# Patient Record
Sex: Male | Born: 1970 | Race: Black or African American | Hispanic: No | Marital: Married | Smoking: Current every day smoker
Health system: Southern US, Community
[De-identification: ages and names within clinical notes are randomized; demographics above are authoritative.]

## PROBLEM LIST (undated history)

## (undated) DIAGNOSIS — I10 Essential (primary) hypertension: Secondary | ICD-10-CM

## (undated) DIAGNOSIS — K209 Esophagitis, unspecified without bleeding: Secondary | ICD-10-CM

## (undated) HISTORY — PX: CLAVICLE SURGERY: SHX598

---

## 2021-09-30 ENCOUNTER — Emergency Department: Payer: Commercial Managed Care - HMO

## 2021-09-30 ENCOUNTER — Emergency Department
Admission: EM | Admit: 2021-09-30 | Discharge: 2021-09-30 | Disposition: A | Discharge status: Home / Self Care | Payer: Commercial Managed Care - HMO | Attending: Emergency Medicine | Admitting: Emergency Medicine

## 2021-09-30 ENCOUNTER — Other Ambulatory Visit: Payer: Self-pay

## 2021-09-30 ENCOUNTER — Encounter: Payer: Self-pay | Admitting: Emergency Medicine

## 2021-09-30 DIAGNOSIS — R42 Dizziness and giddiness: Secondary | ICD-10-CM | POA: Insufficient documentation

## 2021-09-30 DIAGNOSIS — I10 Essential (primary) hypertension: Secondary | ICD-10-CM | POA: Insufficient documentation

## 2021-09-30 DIAGNOSIS — D751 Secondary polycythemia: Secondary | ICD-10-CM | POA: Diagnosis not present

## 2021-09-30 HISTORY — DX: Essential (primary) hypertension: I10

## 2021-09-30 LAB — CBC WITH DIFFERENTIAL/PLATELET
Abs Immature Granulocytes: 0.01 10*3/uL (ref 0.00–0.07)
Basophils Absolute: 0.1 10*3/uL (ref 0.0–0.1)
Basophils Relative: 1 %
Eosinophils Absolute: 0.2 10*3/uL (ref 0.0–0.5)
Eosinophils Relative: 3 %
HCT: 64.1 % — ABNORMAL HIGH (ref 39.0–52.0)
Hemoglobin: 21.5 g/dL (ref 13.0–17.0)
Immature Granulocytes: 0 %
Lymphocytes Relative: 21 %
Lymphs Abs: 1.5 10*3/uL (ref 0.7–4.0)
MCH: 32.8 pg (ref 26.0–34.0)
MCHC: 33.5 g/dL (ref 30.0–36.0)
MCV: 97.7 fL (ref 80.0–100.0)
Monocytes Absolute: 0.7 10*3/uL (ref 0.1–1.0)
Monocytes Relative: 10 %
Neutro Abs: 4.5 10*3/uL (ref 1.7–7.7)
Neutrophils Relative %: 65 %
Platelets: 276 10*3/uL (ref 150–400)
RBC: 6.56 MIL/uL — ABNORMAL HIGH (ref 4.22–5.81)
RDW: 13.5 % (ref 11.5–15.5)
WBC: 6.9 10*3/uL (ref 4.0–10.5)
nRBC: 0 % (ref 0.0–0.2)

## 2021-09-30 LAB — BASIC METABOLIC PANEL
Anion gap: 11 (ref 5–15)
BUN: 8 mg/dL (ref 6–20)
CO2: 28 mmol/L (ref 22–32)
Calcium: 9.1 mg/dL (ref 8.9–10.3)
Chloride: 101 mmol/L (ref 98–111)
Creatinine, Ser: 0.93 mg/dL (ref 0.61–1.24)
GFR, Estimated: >60 mL/min (ref >60)
Glucose, Bld: 109 mg/dL — ABNORMAL HIGH (ref 70–99)
Potassium: 3.9 mmol/L (ref 3.5–5.1)
Sodium: 140 mmol/L (ref 135–145)

## 2021-09-30 LAB — CBC
HCT: 63.7 % — ABNORMAL HIGH (ref 39.0–52.0)
Hemoglobin: 21.3 g/dL (ref 13.0–17.0)
MCH: 33.3 pg (ref 26.0–34.0)
MCHC: 33.4 g/dL (ref 30.0–36.0)
MCV: 99.7 fL (ref 80.0–100.0)
Platelets: 242 10*3/uL (ref 150–400)
RBC: 6.39 MIL/uL — ABNORMAL HIGH (ref 4.22–5.81)
RDW: 13.8 % (ref 11.5–15.5)
WBC: 5.7 10*3/uL (ref 4.0–10.5)
nRBC: 0 % (ref 0.0–0.2)

## 2021-09-30 LAB — TROPONIN I (HIGH SENSITIVITY)
Troponin I (High Sensitivity): 7 ng/L (ref <18)
Troponin I (High Sensitivity): 6 ng/L (ref <18)

## 2021-09-30 MED ORDER — AMLODIPINE BESYLATE 5 MG PO TABS: 5.0000 mg | ORAL_TABLET | Freq: Every day | ORAL | 0 refills | Status: DC

## 2021-09-30 NOTE — ED Triage Notes (Signed)
Pt via POV from home. Pt sent over from [facility]. Per KC, pt c/o chest pain, headache today. States that he has been out of his BP medication since October. KC reports a blood pressure of 187/131 Pt is A&OX4 and NAD.  ? ?Denies any symptoms now.  ?

## 2021-09-30 NOTE — Discharge Instructions (Signed)
Your hemoglobin which is your red blood cell count was very elevated today.  We drew off some of your blood to bring this number down.  You will need to follow-up with oncology.  If you do not hear from them within the next several days please call the number above for Dr. Assunta Gambles office. ? ?I am starting you on the blood pressure medicine called amlodipine.  You can start taking this once a day.  It is important you follow-up with the primary care provider.  I have given you several referrals above. ?

## 2021-09-30 NOTE — ED Provider Notes (Signed)
? ?[facility] ?Provider Note ? ? ? Event Date/Time  ? First MD Initiated Contact with Patient 09/30/21 1728   ?  (approximate) ? ? ?History  ? ?Hypertension ? ? ?HPI ? ?Jeffrey House is a 51 y.o. male with past medical history of hypertension who presents with headache lightheadedness concern for elevated blood pressure.  Patient has been off of his blood pressure medication since October when he ran out.  He over the last several days has had some intermittent lightheadedness and mild frontal headache.  Denies visual change numbness tingling weakness.  Last night did have some associated chest tightness but this resolved after several minutes.  Denies shortness of breath.  Denies any symptoms currently.  Patient has been told that he had elevated blood counts in the past was supposed to see a hematologist prior to moving but has not yet established care.  Denies any pruritus weight loss fevers.  Has had some night sweats for some time. ? ?  ? ?Past Medical History:  ?Diagnosis Date  ? Hypertension   ? ? ?There are no problems to display for this patient. ? ? ? ?Physical Exam  ?Triage Vital Signs: ?ED Triage Vitals  ?Enc Vitals Group  ?   BP 09/30/21 1621 (!) 155/121  ?   Pulse Rate 09/30/21 1621 99  ?   Resp 09/30/21 1621 18  ?   Temp 09/30/21 1621 98.5 ?F (36.9 ?C)  ?   Temp Source 09/30/21 1621 Oral  ?   SpO2 09/30/21 1621 95 %  ?   Weight 09/30/21 1609 160 lb (72.6 kg)  ?   Height 09/30/21 1609 5\' 8"  (1.727 m)  ?   Head Circumference --   ?   Peak Flow --   ?   Pain Score 09/30/21 1609 0  ?   Pain Loc --   ?   Pain Edu? --   ?   Excl. in GC? --   ? ? ?Most recent vital signs: ?Vitals:  ? 09/30/21 1900 09/30/21 1930  ?BP: (!) 154/118 (!) 151/119  ?Pulse: 86 97  ?Resp: 18 20  ?Temp:    ?SpO2: 95% 98%  ? ? ? ?General: Awake, no distress.  ?CV:  Good peripheral perfusion.  ?Resp:  Normal effort.  ?Abd:  No distention.  ?Neuro:             Awake, Alert, Oriented x 3  ?Other:  Aox3, nml  speech  ?PERRL, EOMI, face symmetric, nml tongue movement  ?5/5 strength in the BL upper and lower extremities  ?Sensation grossly intact in the BL upper and lower extremities  ?Finger-nose-finger intact BL ? ? ? ?ED Results / Procedures / Treatments  ?Labs ?(all labs ordered are listed, but only abnormal results are displayed) ?Labs Reviewed  ?BASIC METABOLIC PANEL - Abnormal; Notable for the following components:  ?    Result Value  ? Glucose, Bld 109 (*)   ? All other components within normal limits  ?CBC - Abnormal; Notable for the following components:  ? RBC 6.39 (*)   ? Hemoglobin 21.3 (*)   ? HCT 63.7 (*)   ? All other components within normal limits  ?JAK2 GENOTYPR  ?JAK2 EXONS 12-15  ?ERYTHROPOIETIN  ?TESTOSTERONE  ?CBC WITH DIFFERENTIAL/PLATELET  ?TROPONIN I (HIGH SENSITIVITY)  ?TROPONIN I (HIGH SENSITIVITY)  ? ? ? ?EKG ? ?EKG interpretation performed by myself: NSR, nml axis, nml intervals, no acute ischemic changes ? ? ?RADIOLOGY ?I reviewed the  CXR which does not show any acute cardiopulmonary process; agree with radiology report  ? ? ? ?PROCEDURES: ? ?Critical Care performed: No ? ?Procedures ? ?The patient is on the cardiac monitor to evaluate for evidence of arrhythmia and/or significant heart rate changes. ? ? ?MEDICATIONS ORDERED IN ED: ?Medications - No data to display ? ? ?IMPRESSION / MDM / ASSESSMENT AND PLAN / ED COURSE  ?I reviewed the triage vital signs and the nursing notes. ?             ?               ? ?Differential diagnosis includes, but is not limited to, asymptomatic hypertension, hypertensive emergency, less likely ACS ? ?Patient is a 51 year old male who presents with several vague symptoms including headache lightheadedness chest pain going on for the last several days.  He attributes this to elevated blood pressure.  Has been off of his blood pressure medications for the last several months from when he ran out.  Blood pressure is 150/1 20s.  Patient has no symptoms now.  He  has no neurologic symptoms associate with headache and a nonfocal neurologic exam.  Initial troponin is negative EKG is nonischemic had some mild chest discomfort yesterday which quickly resolved without associated symptoms.  Notably patient's hemoglobin is 21 with hematocrit of 63.  On further questioning he has been told that he had elevated blood counts in the past but moved prior to following up with hematology.  Does smoke cigarettes.  Discussed with Dr. Smith Robert on-call for oncology she recommends phlebotomizing 500 cc then he can follow-up with her. ? ? ?Patient was phlebotomize 500 cc in the ED.  Additional labs sent.  He is currently asymptomatic.  Discussed follow-up with Dr. Smith Robert as well as follow-up with primary care.  Sent prescription for amlodipine to his pharmacy. ? ?Clinical Course as of 09/30/21 2020  ?Tue Sep 30, 2021  ?1816 Hemoglobin(!!): 21.3 [KM]  ?  ?Clinical Course User Index ?[KM] Georga Hacking, MD  ? ? ? ?FINAL CLINICAL IMPRESSION(S) / ED DIAGNOSES  ? ?Final diagnoses:  ?Polycythemia  ?Hypertension, unspecified type  ? ? ? ?Rx / DC Orders  ? ?ED Discharge Orders   ? ?      Ordered  ?  amLODipine (NORVASC) 5 MG tablet  Daily       ? 09/30/21 2019  ? ?  ?  ? ?  ? ? ? ?Note:  This document was prepared using Dragon voice recognition software and may include unintentional dictation errors. ?  ?Georga Hacking, MD ?09/30/21 2020 ? ?

## 2021-09-30 NOTE — ED Notes (Signed)
565mL blood pulled from IV to waste Per MD and oncology for HGB results.  ?

## 2021-10-01 ENCOUNTER — Other Ambulatory Visit: Payer: Self-pay | Admitting: Oncology

## 2021-10-01 DIAGNOSIS — D751 Secondary polycythemia: Secondary | ICD-10-CM | POA: Insufficient documentation

## 2021-10-02 ENCOUNTER — Other Ambulatory Visit: Payer: Self-pay

## 2021-10-02 DIAGNOSIS — D751 Secondary polycythemia: Secondary | ICD-10-CM

## 2021-10-02 LAB — TESTOSTERONE: Testosterone: 425 ng/dL (ref 264–916)

## 2021-10-03 ENCOUNTER — Other Ambulatory Visit: Payer: Managed Care, Other (non HMO)

## 2021-10-03 ENCOUNTER — Encounter: Payer: Managed Care, Other (non HMO) | Admitting: Oncology

## 2021-10-03 ENCOUNTER — Inpatient Hospital Stay: Payer: Commercial Managed Care - HMO | Attending: Oncology

## 2021-10-03 ENCOUNTER — Inpatient Hospital Stay: Payer: Commercial Managed Care - HMO

## 2021-10-03 VITALS — BP 151/105 | HR 99 | Temp 96.8°F | Resp 16

## 2021-10-03 DIAGNOSIS — Z79899 Other long term (current) drug therapy: Secondary | ICD-10-CM | POA: Diagnosis not present

## 2021-10-03 DIAGNOSIS — F17210 Nicotine dependence, cigarettes, uncomplicated: Secondary | ICD-10-CM | POA: Insufficient documentation

## 2021-10-03 DIAGNOSIS — D751 Secondary polycythemia: Secondary | ICD-10-CM

## 2021-10-03 DIAGNOSIS — I10 Essential (primary) hypertension: Secondary | ICD-10-CM | POA: Diagnosis not present

## 2021-10-03 LAB — COMPREHENSIVE METABOLIC PANEL
ALT: 18 U/L (ref 0–44)
AST: 21 U/L (ref 15–41)
Albumin: 3.9 g/dL (ref 3.5–5.0)
Alkaline Phosphatase: 127 U/L — ABNORMAL HIGH (ref 38–126)
Anion gap: 12 (ref 5–15)
BUN: 7 mg/dL (ref 6–20)
CO2: 28 mmol/L (ref 22–32)
Calcium: 9.4 mg/dL (ref 8.9–10.3)
Chloride: 98 mmol/L (ref 98–111)
Creatinine, Ser: 0.75 mg/dL (ref 0.61–1.24)
GFR, Estimated: >60 mL/min (ref >60)
Glucose, Bld: 115 mg/dL — ABNORMAL HIGH (ref 70–99)
Potassium: 3.9 mmol/L (ref 3.5–5.1)
Sodium: 138 mmol/L (ref 135–145)
Total Bilirubin: 0.7 mg/dL (ref 0.3–1.2)
Total Protein: 7.7 g/dL (ref 6.5–8.1)

## 2021-10-03 LAB — CBC WITH DIFFERENTIAL/PLATELET
Abs Immature Granulocytes: 0.01 10*3/uL (ref 0.00–0.07)
Basophils Absolute: 0.1 10*3/uL (ref 0.0–0.1)
Basophils Relative: 1 %
Eosinophils Absolute: 0.1 10*3/uL (ref 0.0–0.5)
Eosinophils Relative: 2 %
HCT: 62.3 % — ABNORMAL HIGH (ref 39.0–52.0)
Hemoglobin: 21.3 g/dL (ref 13.0–17.0)
Immature Granulocytes: 0 %
Lymphocytes Relative: 23 %
Lymphs Abs: 1.3 10*3/uL (ref 0.7–4.0)
MCH: 33.6 pg (ref 26.0–34.0)
MCHC: 34.2 g/dL (ref 30.0–36.0)
MCV: 98.3 fL (ref 80.0–100.0)
Monocytes Absolute: 0.5 10*3/uL (ref 0.1–1.0)
Monocytes Relative: 9 %
Neutro Abs: 3.5 10*3/uL (ref 1.7–7.7)
Neutrophils Relative %: 65 %
Platelets: 253 10*3/uL (ref 150–400)
RBC: 6.34 MIL/uL — ABNORMAL HIGH (ref 4.22–5.81)
RDW: 13.5 % (ref 11.5–15.5)
WBC: 5.4 10*3/uL (ref 4.0–10.5)
nRBC: 0 % (ref 0.0–0.2)

## 2021-10-03 NOTE — Patient Instructions (Addendum)
[facility] AT [place]  Discharge Instructions: ?Thank you for choosing [facility] to provide your oncology and hematology care.  ?If you have a lab appointment with the [facility], please go directly to the [facility] and check in at the registration area. ? ?We strive to give you quality time with your provider. You may need to reschedule your appointment if you arrive late (15 or more minutes).  Arriving late affects you and other patients whose appointments are after yours.  Also, if you miss three or more appointments without notifying the office, you may be dismissed from the clinic at the provider?s discretion.    ?  ?For prescription refill requests, have your pharmacy contact our office and allow 72 hours for refills to be completed.   ? ?Should you have questions after your visit or need to cancel or reschedule your appointment, please contact [facility] [place] [facility] [place]  [phone] and follow the prompts.  Office hours are 8:00 a.m. to 4:30 p.m. Monday - Friday. Please note that voicemails left after 4:00 p.m. may not be returned until the following business day.  We are closed weekends and major holidays. You have access to a nurse at all times for urgent questions. Please call the main number to the clinic [phone] and follow the prompts. ? ?For any non-urgent questions, you may also contact your provider using MyChart. We now offer e-Visits for anyone 21 and older to request care online for non-urgent symptoms. For details visit [email or web address]. ?  ?Also download the MyChart app! Go to the app store, search "MyChart", open the app, select [facility], and log in with your MyChart username and password. ? ?Due to Covid, a mask is required upon entering the hospital/clinic. If you do not have a mask, one will be given to you upon arrival. For doctor visits, patients may have 1 support person aged 33 or older with them. For  treatment visits, patients cannot have anyone with them due to current Covid guidelines and our immunocompromised population.  ? ?Therapeutic Phlebotomy, Care After ?The following information offers guidance on how to care for yourself after your procedure. Your health care provider may also give you more specific instructions. If you have problems or questions, contact your health care provider. ?What can I expect after the procedure? ?After therapeutic phlebotomy, it is common to have: ?Light-headedness or dizziness. You may feel faint. ?Nausea. ?Tiredness (fatigue). ?Follow these instructions at home: ?Eating and drinking ?Be sure to eat well-balanced meals for the next 24 hours. ?Drink enough fluid to keep your urine pale yellow. ?Avoid drinking alcohol on the day that you had the procedure. ?Activity ? ?Return to your normal activities as told by your health care provider. Most people can go back to their normal activities right away. ?Avoid activities that take a lot of effort for about 5 hours after the procedure. Athletes should avoid strenuous exercise for at least 12 hours. ?Avoid heavy lifting or pulling for about 5 hours after the procedure. Do not lift anything that is heavier than 10 lb (4.5 kg). ?Change positions slowly for the remainder of the day, like from sitting to standing. This can help prevent light-headedness or fainting. ?If you feel light-headed, lie down until the feeling goes away. ?Needle insertion site care ? ?Keep your bandage (dressing) dry. You can remove the bandage after about 5 hours or as told by your health care provider. ?If you have bleeding from the needle insertion  site, raise (elevate) your arm and press firmly on the site until the bleeding stops. ?If you have bruising at the site, apply ice to the area. To do this: ?Put ice in a plastic bag. ?Place a towel between your skin and the bag. ?Leave the ice on for 20 minutes, 2-3 times a day for the first 24 hours. ?Remove the  ice if your skin turns bright red so you do not damage the area. ?If the swelling does not go away after 24 hours, apply a warm, moist cloth (warm compress) to the area for 20 minutes, 2-3 times a day. ?General instructions ?Do not use any products that contain nicotine or tobacco, like cigarettes, chewing tobacco, and vaping devices, such as e-cigarettes, for at least 30 minutes after the procedure. If you need help quitting, ask your health care provider. ?Keep all follow-up visits. You may need to continue having regular blood tests and therapeutic phlebotomy treatments as directed. ?Contact a health care provider if: ?You have redness, swelling, or pain at the needle insertion site. ?Fluid or blood is coming from the needle insertion site. ?Pus or a bad smell is coming from the needle insertion site. ?The needle insertion site feels warm to the touch. ?You feel light-headed, dizzy, or nauseous, and the feeling does not go away. ?You have new bruising at the needle insertion site. ?You feel weaker than normal. ?You have a fever or chills. ?Get help right away if: ?You have chest pain. ?You have trouble breathing. ?You have severe nausea or vomiting. ?Summary ?After the procedure, it is common to have some light-headedness, dizziness, nausea, or tiredness (fatigue). ?Be sure to eat well-balanced meals for the next 24 hours. Drink enough fluid to keep your urine pale yellow. ?Return to your normal activities as told by your health care provider. ?Keep all follow-up visits. You may need to continue having regular blood tests and therapeutic phlebotomy treatments as directed. ?This information is not intended to replace advice given to you by your health care provider. Make sure you discuss any questions you have with your health care provider. ?Document Revised: 11/27/2020 Document Reviewed: 11/27/2020 ?Elsevier Patient Education ? [place]. ? ?

## 2021-10-06 ENCOUNTER — Encounter: Payer: Self-pay | Admitting: Oncology

## 2021-10-06 ENCOUNTER — Telehealth: Payer: Self-pay | Admitting: *Deleted

## 2021-10-06 NOTE — Telephone Encounter (Signed)
Note for work was made. Contacted pt to inform that letter is ready. Per wife, pt will pick up letter tomorrow. Informed her it will be downstairs at the front desk. ?

## 2021-10-06 NOTE — Telephone Encounter (Signed)
Patient called requesting a letter to return to work today ?

## 2021-10-07 ENCOUNTER — Encounter: Payer: Self-pay | Admitting: Oncology

## 2021-10-07 ENCOUNTER — Telehealth: Payer: Self-pay | Admitting: Oncology

## 2021-10-08 ENCOUNTER — Other Ambulatory Visit: Payer: Self-pay

## 2021-10-08 DIAGNOSIS — D751 Secondary polycythemia: Secondary | ICD-10-CM

## 2021-10-10 ENCOUNTER — Encounter: Payer: Managed Care, Other (non HMO) | Admitting: Oncology

## 2021-10-10 ENCOUNTER — Inpatient Hospital Stay: Payer: Commercial Managed Care - HMO

## 2021-10-10 ENCOUNTER — Other Ambulatory Visit: Payer: Managed Care, Other (non HMO)

## 2021-10-10 ENCOUNTER — Encounter: Payer: Self-pay | Admitting: Oncology

## 2021-10-10 ENCOUNTER — Inpatient Hospital Stay (HOSPITAL_BASED_OUTPATIENT_CLINIC_OR_DEPARTMENT_OTHER): Payer: Commercial Managed Care - HMO | Admitting: Oncology

## 2021-10-10 VITALS — BP 142/108 | HR 89 | Temp 97.9°F | Resp 18 | Ht 68.5 in | Wt 166.0 lb

## 2021-10-10 DIAGNOSIS — F17210 Nicotine dependence, cigarettes, uncomplicated: Secondary | ICD-10-CM

## 2021-10-10 DIAGNOSIS — D751 Secondary polycythemia: Secondary | ICD-10-CM

## 2021-10-10 DIAGNOSIS — I10 Essential (primary) hypertension: Secondary | ICD-10-CM

## 2021-10-10 LAB — HEMOGLOBIN AND HEMATOCRIT, BLOOD
HCT: 51.9 % (ref 39.0–52.0)
Hemoglobin: 17.6 g/dL — ABNORMAL HIGH (ref 13.0–17.0)

## 2021-10-10 NOTE — Progress Notes (Signed)
Per MD, no phlebotomy today. 

## 2021-10-10 NOTE — Progress Notes (Signed)
Pt was escorted to ED after initial consult with internal med on 4/18for an elevated BP. ?

## 2021-10-10 NOTE — Progress Notes (Signed)
? ?Hematology/Oncology Consult note ?[facility] [place] ?[phone] [phone] ? ?Patient Care Team: ?Pcp, No as PCP - General  ? ?Name of the patient: Jeffrey House  ?[id number]  ?07-01-1970  ? ? ?Reason for referral-polycythemia ?  ?Referring physician-Dr. Starleen Blue ? ?Date of visit: 10/10/21 ? ? ?History of presenting illness- Patient is a 51 year old African-American male who presented to the ER with symptoms of headache and lightheadedness.  He was noted to have an increased him hemoglobin and hematocrit of 21/63.7.  He received 500 cc of phlebotomy in the ER and subsequently 1 more session of phlebotomy as an outpatient.  Work-up so far for polycythemia showed negative JAK2 mutation.  Exon 12 mutation testing pending.  EPO levels were low normal at 4.8.  Testosterone level normal at 425.  Patient is a chronic smoker and states that 1 pack of cigarettes usually lasts him for about 2 weeks.  No known chronic lung disease.  Reports getting a good night sleep and denies any snoring at night.  Denies any blood loss in his urine.  Overall appetite is good and he denies any unintentional weight loss. ? ?ECOG PS- 1 ? ?Pain scale- 0 ? ? ?Review of systems- Review of Systems  ?Constitutional:  Positive for malaise/fatigue. Negative for chills, fever and weight loss.  ?HENT:  Negative for congestion, ear discharge and nosebleeds.   ?Eyes:  Negative for blurred vision.  ?Respiratory:  Negative for cough, hemoptysis, sputum production, shortness of breath and wheezing.   ?Cardiovascular:  Negative for chest pain, palpitations, orthopnea and claudication.  ?Gastrointestinal:  Negative for abdominal pain, blood in stool, constipation, diarrhea, heartburn, melena, nausea and vomiting.  ?Genitourinary:  Negative for dysuria, flank pain, frequency, hematuria and urgency.  ?Musculoskeletal:  Negative for back pain, joint pain and myalgias.  ?Skin:  Negative for rash.  ?Neurological:  Negative  for dizziness, tingling, focal weakness, seizures, weakness and headaches.  ?Endo/Heme/Allergies:  Does not bruise/bleed easily.  ?Psychiatric/Behavioral:  Negative for depression and suicidal ideas. The patient does not have insomnia.   ? ?Allergies  ?Allergen Reactions  ? Asa [Aspirin]   ? Latex   ? ? ?Patient Active Problem List  ? Diagnosis Date Noted  ? Hypertension 10/10/2021  ? Polycythemia 10/01/2021  ? ? ? ?Past Medical History:  ?Diagnosis Date  ? Hypertension   ? ? ? ?History reviewed. No pertinent surgical history. ? ?Social History  ? ?Socioeconomic History  ? Marital status: Married  ?  Spouse name: Not on file  ? Number of children: Not on file  ? Years of education: Not on file  ? Highest education level: Not on file  ?Occupational History  ? Not on file  ?Tobacco Use  ? Smoking status: Every Day  ?  Packs/day: 0.25  ?  Types: Cigarettes  ? Smokeless tobacco: Never  ?Substance and Sexual Activity  ? Alcohol use: Yes  ?  Comment: 1 drink in the past 2 weeks.  ? Drug use: Not on file  ? Sexual activity: Not on file  ?Other Topics Concern  ? Not on file  ?Social History Narrative  ? Not on file  ? ?Social Determinants of Health  ? ?Financial Resource Strain: Not on file  ?Food Insecurity: Not on file  ?Transportation Needs: Not on file  ?Physical Activity: Not on file  ?Stress: Not on file  ?Social Connections: Not on file  ?Intimate Partner Violence: Not on file  ? ?  ?Family History  ?Problem Relation Age  of Onset  ? Hypertension Mother   ? Hypertension Sister   ? ? ? ?Current Outpatient Medications:  ?  amLODipine (NORVASC) 5 MG tablet, Take 1 tablet (5 mg total) by mouth daily., Disp: 30 tablet, Rfl: 0 ?  Aspirin 325 MG CAPS, Take by mouth., Disp: , Rfl:  ? ? ?Physical exam:  ?Vitals:  ? 10/10/21 0948  ?BP: (!) 142/108  ?Pulse: 89  ?Resp: 18  ?Temp: 97.9 ?F (36.6 ?C)  ?SpO2: 100%  ?Weight: 166 lb (75.3 kg)  ?Height: 5' 8.5" (1.74 m)  ? ?Physical Exam ?Constitutional:   ?   General: He is not in  acute distress. ?Cardiovascular:  ?   Rate and Rhythm: Normal rate and regular rhythm.  ?   Heart sounds: Normal heart sounds.  ?Pulmonary:  ?   Effort: Pulmonary effort is normal.  ?   Breath sounds: Normal breath sounds.  ?Abdominal:  ?   General: Bowel sounds are normal.  ?   Palpations: Abdomen is soft.  ?   Comments: No palpable splenomegaly  ?Skin: ?   General: Skin is warm and dry.  ?Neurological:  ?   Mental Status: He is alert and oriented to person, place, and time.  ?  ? ? ? ? ?  Latest Ref Rng & Units 10/03/2021  ?  8:48 AM  ?CMP  ?Glucose 70 - 99 mg/dL 115    ?BUN 6 - 20 mg/dL 7    ?Creatinine 0.61 - 1.24 mg/dL 0.75    ?Sodium 135 - 145 mmol/L 138    ?Potassium 3.5 - 5.1 mmol/L 3.9    ?Chloride 98 - 111 mmol/L 98    ?CO2 22 - 32 mmol/L 28    ?Calcium 8.9 - 10.3 mg/dL 9.4    ?Total Protein 6.5 - 8.1 g/dL 7.7    ?Total Bilirubin 0.3 - 1.2 mg/dL 0.7    ?Alkaline Phos 38 - 126 U/L 127    ?AST 15 - 41 U/L 21    ?ALT 0 - 44 U/L 18    ? ? ?  Latest Ref Rng & Units 10/10/2021  ?  9:35 AM  ?CBC  ?Hemoglobin 13.0 - 17.0 g/dL 17.6    ?Hematocrit 39.0 - 52.0 % 51.9    ? ? ?No images are attached to the encounter. ? ?DG Chest 2 View ? ?Result Date: 09/30/2021 ?CLINICAL DATA:  Chest pain. EXAM: CHEST - 2 VIEW COMPARISON:  None. FINDINGS: Cardiac silhouette is of normal size. Mildly enlarged azygous vein contour on the right mediastinum. The lungs are clear. No pleural effusion or pneumothorax. No acute skeletal abnormality. IMPRESSION: No acute lung process. Mildly prominent azygous vein border at the junction with the superior vena cava. This may be normal for this patient. Electronically Signed   By: Yvonne Kendall M.D.   On: 09/30/2021 17:06   ? ?Assessment and plan- Patient is a 51 y.o. male referred for polycythemia ? 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  Despite JAK2 mutation being negative his EPO level is at the lower limit of normal at 4.8.  Typically with a  hemoglobin of 21 of this was a secondary polycythemia we would expect his EPO levels to be high.  I will therefore proceed with a bone marrow biopsy at this time to rule out a primary myeloproliferative process.  I will also be doing a myeloid panel sequencing either on the bone marrow or in his peripheral blood.  Exon 12 mutation testing  is also pending.  After 2 sessions of phlebotomy his hemoglobin and hematocrit have come down to 17.6/51.9 today.  I will hold off on phlebotomy at this time and consider H&H for possible phlebotomy if hematocrit is greater than 55 every 2 weeks until we have the results of the bone marrow biopsy back. ? ?In terms of causes for secondary polycythemia: Given that his EPO levels are low normal I do not suspect tumor induced polycythemia.  If bone marrow biopsy does not reveal a primary myeloproliferative process I will do further work-up for secondary polycythemia including chest x-ray, sleep study and consideration for echocardiogram with bubble study. ? ?I will see him back in about 3 weeks time after the results of bone marrow biopsy are back.  Discussed risks and benefits of bone marrow biopsy including all but not limited to biopsy site pain and possible infection.  Patient understands and agrees to proceed as planned ? ? ?Thank you for this kind referral and the opportunity to participate in the care of this patient ? ? ?Visit Diagnosis ?1. Hypertension, unspecified type   ?2. Polycythemia   ? ? ?Dr. Randa Evens, MD, MPH ?[facility] ?[phone] ?10/10/2021 ? ? ? ? ? ? ? ?    ? ? ? ? ?

## 2021-10-13 LAB — ERYTHROPOIETIN: Erythropoietin: 4.8 m[IU]/mL (ref 2.6–18.5)

## 2021-10-13 LAB — JAK2 GENOTYPR

## 2021-10-14 LAB — JAK2 EXONS 12-15

## 2021-10-16 ENCOUNTER — Other Ambulatory Visit: Payer: Self-pay | Admitting: *Deleted

## 2021-10-16 ENCOUNTER — Other Ambulatory Visit: Payer: Self-pay | Admitting: Student

## 2021-10-16 ENCOUNTER — Telehealth: Payer: Self-pay | Admitting: *Deleted

## 2021-10-16 DIAGNOSIS — D751 Secondary polycythemia: Secondary | ICD-10-CM

## 2021-10-16 NOTE — Telephone Encounter (Signed)
Called patient's home phone number and it rang and rang and then it says please try back again patient does not have any voicemail. ?Patient is needing to have a bone marrow biopsy and the date that it is going to be done is made on may 8  arrival at 7: 30 [facility] medical mall.  Patient will get medicine to make him sleepy so he will need a driver.  Patient will be n.p.o. for 8 hours prior to the time of procedure.  If patient has any medicines that he has to take it will be with just a sip of water.  Usually the patient is going to be there between 2 and 4 hours.  I will try again to see if I can get the patient on the phone to go over all these instructions ?

## 2021-10-17 ENCOUNTER — Encounter: Payer: Self-pay | Admitting: Oncology

## 2021-10-17 LAB — NGS JAK2 EXONS 12-15

## 2021-10-20 ENCOUNTER — Ambulatory Visit: Admission: RE | Admit: 2021-10-20 | Payer: Managed Care, Other (non HMO) | Source: Ambulatory Visit

## 2021-10-20 ENCOUNTER — Inpatient Hospital Stay
Admission: RE | Admit: 2021-10-20 | Payer: Managed Care, Other (non HMO) | Source: Ambulatory Visit | Admitting: Radiology

## 2021-10-20 LAB — INTELLIGEN MYELOID

## 2021-10-24 ENCOUNTER — Inpatient Hospital Stay: Payer: Commercial Managed Care - HMO

## 2021-10-24 ENCOUNTER — Inpatient Hospital Stay: Payer: Commercial Managed Care - HMO | Attending: Oncology

## 2021-10-27 ENCOUNTER — Telehealth: Payer: Self-pay

## 2021-10-27 NOTE — Progress Notes (Signed)
Patient on schedule for BMB 5/24, spoke with patients wife on phone with pre procedure instructions given, made aware to be to be here at 0730, NPO after MN prior to procedure as well as driver post procedure/recovery/discharge.stated understanding. ?

## 2021-10-27 NOTE — Telephone Encounter (Signed)
Reached out to pt in regards to BMB on 11/05/21 for a 7:30am arrival for a 8:30am procedure pt was having scheduling conflict so I wanted to make sure pt was okay with appointment time/date. Pt stated so far he will be able to make the [facility] appointment and if anything arises please contact us ASAP to make the neccesarry changes. Pt agrees and understands.  ?

## 2021-10-30 ENCOUNTER — Other Ambulatory Visit: Payer: Self-pay

## 2021-10-30 ENCOUNTER — Telehealth: Payer: Self-pay | Admitting: Oncology

## 2021-10-30 ENCOUNTER — Telehealth: Payer: Self-pay

## 2021-10-30 DIAGNOSIS — I10 Essential (primary) hypertension: Secondary | ICD-10-CM

## 2021-10-30 MED ORDER — AMLODIPINE BESYLATE 5 MG PO TABS: 5.0000 mg | ORAL_TABLET | Freq: Every day | ORAL | 0 refills | Status: DC

## 2021-10-30 NOTE — Telephone Encounter (Signed)
Called pt to r/s Phlembotomy appt, pt states that he cannot do afternoon appts.Marland KitchenMarland KitchenKJ

## 2021-10-30 NOTE — Telephone Encounter (Signed)
Pt was referred to Dr. Walden Field in Internal Medicine due to in need for a PCP.

## 2021-10-31 ENCOUNTER — Inpatient Hospital Stay: Payer: Commercial Managed Care - HMO | Admitting: Oncology

## 2021-11-04 ENCOUNTER — Other Ambulatory Visit: Payer: Self-pay | Admitting: Radiology

## 2021-11-05 ENCOUNTER — Encounter: Payer: Self-pay | Admitting: Radiology

## 2021-11-05 ENCOUNTER — Other Ambulatory Visit: Payer: Self-pay

## 2021-11-05 ENCOUNTER — Ambulatory Visit
Admission: RE | Admit: 2021-11-05 | Discharge: 2021-11-05 | Disposition: A | Discharge status: Home / Self Care | Payer: Commercial Managed Care - HMO | Source: Ambulatory Visit | Attending: Oncology | Admitting: Oncology

## 2021-11-05 DIAGNOSIS — I10 Essential (primary) hypertension: Secondary | ICD-10-CM | POA: Diagnosis not present

## 2021-11-05 DIAGNOSIS — D751 Secondary polycythemia: Secondary | ICD-10-CM | POA: Diagnosis present

## 2021-11-05 HISTORY — PX: IR BONE MARROW BIOPSY & ASPIRATION: IMG5727

## 2021-11-05 LAB — CBC WITH DIFFERENTIAL/PLATELET
Abs Immature Granulocytes: 0.04 10*3/uL (ref 0.00–0.07)
Basophils Absolute: 0.1 10*3/uL (ref 0.0–0.1)
Basophils Relative: 1 %
Eosinophils Absolute: 0.4 10*3/uL (ref 0.0–0.5)
Eosinophils Relative: 4 %
HCT: 56.1 % — ABNORMAL HIGH (ref 39.0–52.0)
Hemoglobin: 19.2 g/dL — ABNORMAL HIGH (ref 13.0–17.0)
Immature Granulocytes: 1 %
Lymphocytes Relative: 16 %
Lymphs Abs: 1.3 10*3/uL (ref 0.7–4.0)
MCH: 33.3 pg (ref 26.0–34.0)
MCHC: 34.2 g/dL (ref 30.0–36.0)
MCV: 97.4 fL (ref 80.0–100.0)
Monocytes Absolute: 0.9 10*3/uL (ref 0.1–1.0)
Monocytes Relative: 11 %
Neutro Abs: 5.5 10*3/uL (ref 1.7–7.7)
Neutrophils Relative %: 67 %
Platelets: 262 10*3/uL (ref 150–400)
RBC: 5.76 MIL/uL (ref 4.22–5.81)
RDW: 13.2 % (ref 11.5–15.5)
WBC: 8.2 10*3/uL (ref 4.0–10.5)
nRBC: 0 % (ref 0.0–0.2)

## 2021-11-05 MED ORDER — FENTANYL CITRATE (PF) 100 MCG/2ML IJ SOLN
INTRAMUSCULAR | Status: AC | PRN
  Administered 2021-11-05: 50 ug via INTRAVENOUS

## 2021-11-05 MED ORDER — FENTANYL CITRATE (PF) 100 MCG/2ML IJ SOLN
INTRAMUSCULAR | Status: AC
  Filled 2021-11-05: qty 2

## 2021-11-05 MED ORDER — SODIUM CHLORIDE 0.9 % IV SOLN
INTRAVENOUS | Status: DC
  Filled 2021-11-05: qty 1000

## 2021-11-05 MED ORDER — MIDAZOLAM HCL 5 MG/5ML IJ SOLN
INTRAMUSCULAR | Status: AC | PRN
Start: 2021-11-05 — End: 2021-11-05
  Administered 2021-11-05: 1 mg via INTRAVENOUS

## 2021-11-05 MED ORDER — LIDOCAINE 1% INJECTION FOR CIRCUMCISION
INJECTION | INTRAVENOUS | Status: DC | PRN
  Administered 2021-11-05: 10 mL via SUBCUTANEOUS

## 2021-11-05 MED ORDER — MIDAZOLAM HCL 2 MG/2ML IJ SOLN
INTRAMUSCULAR | Status: AC
  Filled 2021-11-05: qty 2

## 2021-11-05 MED ORDER — HEPARIN SOD (PORK) LOCK FLUSH 100 UNIT/ML IV SOLN
INTRAVENOUS | Status: AC
  Filled 2021-11-05: qty 5

## 2021-11-05 NOTE — Procedures (Signed)
Interventional Radiology Procedure Note  Date of Procedure: 11/05/2021  Procedure: BMBx  Findings:  1. BMBx right ilium    Complications: No immediate complications noted.   Estimated Blood Loss: minimal  Follow-up and Recommendations: 1. Bedrest 1 hour    Olive Bass, MD  Vascular & Interventional Radiology  11/05/2021 9:25 AM

## 2021-11-05 NOTE — Consult Note (Signed)
 Chief Complaint: Patient was seen in consultation today for polycythemia at the request of Rao,Archana C  Referring Physician(s): Rao,Archana C  Supervising Physician: El-Abd, Yasser J  Patient Status: [facility] - Out-pt  History of Present Illness: Jeffrey House is a 50 y.o. male with PMHx of HTN, with newly diagnosed polycythemia who has been seen by hematology, Dr. Rao on 4/28 and request received for image guided bone marrow biopsy with moderate sedation.  The patient has had a H&P performed within the last 30 days, all history, medications, and exam have been reviewed. The patient denies any interval changes since the H&P.  The patient denies any current chest pain or shortness of breath. The patient denies any history of sleep apnea or chronic oxygen use. He has no known complications to sedation.    Past Medical History:  Diagnosis Date   Hypertension     Past Surgical History:  Procedure Laterality Date   CLAVICLE SURGERY Right     Allergies: Asa [aspirin] and Latex  Medications: Prior to Admission medications   Medication Sig Start Date End Date Taking? Authorizing Provider  amLODipine (NORVASC) 5 MG tablet Take 1 tablet (5 mg total) by mouth daily. 10/30/21 11/29/21 Yes Rao, Archana C, MD  Aspirin 325 MG CAPS Take by mouth.   Yes [provider]     Family History  Problem Relation Age of Onset   Hypertension Mother    Hypertension Sister     Social History   Socioeconomic History   Marital status: Married    Spouse name: Not on file   Number of children: Not on file   Years of education: Not on file   Highest education level: Not on file  Occupational History   Not on file  Tobacco Use   Smoking status: Every Day    Packs/day: 0.25    Types: Cigarettes   Smokeless tobacco: Never  Substance and Sexual Activity   Alcohol use: Yes    Comment: 1 drink in the past 2 weeks.   Drug use: Not on file   Sexual activity: Not on file  Other  Topics Concern   Not on file  Social History Narrative   Not on file   Social Determinants of Health   Financial Resource Strain: Not on file  Food Insecurity: Not on file  Transportation Needs: Not on file  Physical Activity: Not on file  Stress: Not on file  Social Connections: Not on file   Review of Systems: A 12 point ROS discussed and pertinent positives are indicated in the HPI above.  All other systems are negative.  Review of Systems  Vital Signs: BP (!) 151/110   Pulse 87   Temp 98.2 F (36.8 C) (Oral)   Resp 17   Ht 5' 8" (1.727 m)   Wt 165 lb (74.8 kg)   SpO2 99%   BMI 25.09 kg/m   Physical Exam Constitutional:      Appearance: Normal appearance.  HENT:     Head: Normocephalic and atraumatic.  Cardiovascular:     Rate and Rhythm: Normal rate and regular rhythm.  Pulmonary:     Effort: Pulmonary effort is normal. No respiratory distress.  Neurological:     Mental Status: He is alert and oriented to person, place, and time.    Imaging: No results found.  Labs:  CBC: Recent Labs    09/30/21 1623 09/30/21 1941 10/03/21 0848 10/10/21 0935 11/05/21 0751  WBC 5.7 6.9 5.4  --    8.2  HGB 21.3* 21.5* 21.3* 17.6* 19.2*  HCT 63.7* 64.1* 62.3* 51.9 56.1*  PLT 242 276 253  --  262    COAGS: No results for input(s): INR, APTT in the last 8760 hours.  BMP: Recent Labs    09/30/21 1623 10/03/21 0848  NA 140 138  K 3.9 3.9  CL 101 98  CO2 28 28  GLUCOSE 109* 115*  BUN 8 7  CALCIUM 9.1 9.4  CREATININE 0.93 0.75  GFRNONAA >60 >60    LIVER FUNCTION TESTS: Recent Labs    10/03/21 0848  BILITOT 0.7  AST 21  ALT 18  ALKPHOS 127*  PROT 7.7  ALBUMIN 3.9    Assessment and Plan: This is a 50 year old male with PMHx of HTN, newly diagnosed polycythemia who has been seen by hematology, Dr. Rao on 4/28 and request received for image guided bone marrow biopsy with moderate sedation.  The patient has been NPO, labs and vitals have been  reviewed.  Risks and benefits of image guided bone marrow biopsy with moderate sedation was discussed with the patient and/or patient's family including, but not limited to bleeding, infection, damage to adjacent structures or low yield requiring additional tests.  All of the questions were answered and there is agreement to proceed.  Consent signed and in chart.   Thank you for this interesting consult.  I greatly enjoyed meeting Jeffrey House and look forward to participating in their care.  A copy of this report was sent to the requesting provider on this date.  Electronically Signed: MORGAN, KOREEN D, PA-C 11/05/2021, 8:47 AM   I spent a total of 15 Minutes  in face to face in clinical consultation, greater than 50% of which was counseling/coordinating care for polycythemia.  

## 2021-11-06 LAB — SURGICAL PATHOLOGY

## 2021-11-06 MED ORDER — LIDOCAINE HCL 1 % IJ SOLN: 5.0000 mL | Freq: Once | INTRAMUSCULAR | Status: DC

## 2021-11-07 ENCOUNTER — Inpatient Hospital Stay: Payer: Commercial Managed Care - HMO

## 2021-11-14 ENCOUNTER — Encounter (HOSPITAL_COMMUNITY): Payer: Self-pay | Admitting: Oncology

## 2021-11-14 ENCOUNTER — Inpatient Hospital Stay: Payer: Commercial Managed Care - HMO | Attending: Oncology | Admitting: Oncology

## 2021-11-14 ENCOUNTER — Encounter: Payer: Self-pay | Admitting: Oncology

## 2021-11-14 VITALS — Wt 159.3 lb

## 2021-11-14 DIAGNOSIS — I10 Essential (primary) hypertension: Secondary | ICD-10-CM

## 2021-11-14 DIAGNOSIS — D751 Secondary polycythemia: Secondary | ICD-10-CM | POA: Diagnosis not present

## 2021-11-14 DIAGNOSIS — Z79899 Other long term (current) drug therapy: Secondary | ICD-10-CM | POA: Diagnosis not present

## 2021-11-14 DIAGNOSIS — F17210 Nicotine dependence, cigarettes, uncomplicated: Secondary | ICD-10-CM | POA: Insufficient documentation

## 2021-11-14 NOTE — Progress Notes (Unsigned)
  Pt states he will need an adjustment on his BP medications feels like one tablet is not doing the job he has been taking 2x a day. Does not have a PCP and stated Dr. Smith Robert is who prescribes it.

## 2021-11-15 ENCOUNTER — Encounter: Payer: Self-pay | Admitting: Oncology

## 2021-11-15 NOTE — Progress Notes (Signed)
Hematology/Oncology Consult note [facility]  Telephone:([phone] [phone]  Patient Care Team: Pcp, No as PCP - General Sindy Guadeloupe, MD as Consulting Physician (Oncology)   Name of the patient: Jeffrey House  [id number]  12-29-1970   Date of visit: 11/15/21  Diagnosis- secondary polycythemia of unclear etiology  Chief complaint/ Reason for visit- discuss bone marrow results and further management  Heme/Onc history:  Patient is a 51 year old African-American male who presented to the ER with symptoms of headache and lightheadedness.  He was noted to have an increased him hemoglobin and hematocrit of 21/63.7.  He received 500 cc of phlebotomy in the ER and subsequently 1 more session of phlebotomy as an outpatient.  Work-up so far for polycythemia showed negative JAK2 mutation.  Exon 12 mutation testing pending.  EPO levels were low normal at 4.8.  Testosterone level normal at 425.  Patient is a chronic smoker and states that 1 pack of cigarettes usually lasts him for about 2 weeks.  No known chronic lung disease.  Reports getting a good night sleep and denies any snoring at night.  Denies any blood loss in his urine.  Overall appetite is good and he denies any unintentional weight loss.    Interval history- no new complaints today  ECOG PS- 1 Pain scale-0  Review of systems- Review of Systems  Constitutional:  Negative for chills, fever, malaise/fatigue and weight loss.  HENT:  Negative for congestion, ear discharge and nosebleeds.   Eyes:  Negative for blurred vision.  Respiratory:  Negative for cough, hemoptysis, sputum production, shortness of breath and wheezing.   Cardiovascular:  Negative for chest pain, palpitations, orthopnea and claudication.  Gastrointestinal:  Negative for abdominal pain, blood in stool, constipation, diarrhea, heartburn, melena, nausea and vomiting.  Genitourinary:  Negative for dysuria, flank pain,  frequency, hematuria and urgency.  Musculoskeletal:  Negative for back pain, joint pain and myalgias.  Skin:  Negative for rash.  Neurological:  Negative for dizziness, tingling, focal weakness, seizures, weakness and headaches.  Endo/Heme/Allergies:  Does not bruise/bleed easily.  Psychiatric/Behavioral:  Negative for depression and suicidal ideas. The patient does not have insomnia.       Allergies  Allergen Reactions   Asa [Aspirin]    Latex      Past Medical History:  Diagnosis Date   Hypertension      Past Surgical History:  Procedure Laterality Date   CLAVICLE SURGERY Right    IR BONE MARROW BIOPSY & ASPIRATION  11/05/2021    Social History   Socioeconomic History   Marital status: Married    Spouse name: Not on file   Number of children: Not on file   Years of education: Not on file   Highest education level: Not on file  Occupational History   Not on file  Tobacco Use   Smoking status: Every Day    Packs/day: 0.25    Types: Cigarettes   Smokeless tobacco: Never  Substance and Sexual Activity   Alcohol use: Yes    Comment: 1 drink in the past 2 weeks.   Drug use: Not on file   Sexual activity: Not on file  Other Topics Concern   Not on file  Social History Narrative   Not on file   Social Determinants of Health   Financial Resource Strain: Not on file  Food Insecurity: Not on file  Transportation Needs: Not on file  Physical Activity: Not on file  Stress: Not  on file  Social Connections: Not on file  Intimate Partner Violence: Not on file    Family History  Problem Relation Age of Onset   Hypertension Mother    Hypertension Sister      Current Outpatient Medications:    amLODipine (NORVASC) 5 MG tablet, Take 1 tablet (5 mg total) by mouth daily., Disp: 30 tablet, Rfl: 0   Aspirin 325 MG CAPS, Take by mouth., Disp: , Rfl:   Physical exam:  Vitals:   11/14/21 1016  Weight: 159 lb 4.8 oz (72.3 kg)   Physical Exam      Latest Ref  Rng & Units 10/03/2021    8:48 AM  CMP  Glucose 70 - 99 mg/dL 115    BUN 6 - 20 mg/dL 7    Creatinine 0.61 - 1.24 mg/dL 0.75    Sodium 135 - 145 mmol/L 138    Potassium 3.5 - 5.1 mmol/L 3.9    Chloride 98 - 111 mmol/L 98    CO2 22 - 32 mmol/L 28    Calcium 8.9 - 10.3 mg/dL 9.4    Total Protein 6.5 - 8.1 g/dL 7.7    Total Bilirubin 0.3 - 1.2 mg/dL 0.7    Alkaline Phos 38 - 126 U/L 127    AST 15 - 41 U/L 21    ALT 0 - 44 U/L 18        Latest Ref Rng & Units 11/05/2021    7:51 AM  CBC  WBC 4.0 - 10.5 K/uL 8.2    Hemoglobin 13.0 - 17.0 g/dL 19.2    Hematocrit 39.0 - 52.0 % 56.1    Platelets 150 - 400 K/uL 262      No images are attached to the encounter.  IR BONE MARROW BIOPSY & ASPIRATION  Result Date: 11/05/2021 INDICATION: polycythemia EXAM: IR BONE MARRO BIOPY AND ASPIRATION MEDICATIONS: None. ANESTHESIA/SEDATION: Moderate (conscious) sedation was employed during this procedure. A total of Versed 1 mg and Fentanyl 50 mcg was administered intravenously. Moderate Sedation Time: 10 minutes. The patient's level of consciousness and vital signs were monitored continuously by radiology nursing throughout the procedure under my direct supervision. FLUOROSCOPY TIME:  Fluoroscopy Time: 1.0 (15 mGy COMPLICATIONS: None immediate. PROCEDURE: Informed written consent was obtained from the patient after a thorough discussion of the procedural risks, benefits and alternatives. All questions were addressed. Maximal Sterile Barrier Technique was utilized including caps, mask, sterile gowns, sterile gloves, sterile drape, hand hygiene and skin antiseptic. A timeout was performed prior to the initiation of the procedure. The patient was placed prone on the IR exam table. Limited fluoroscopy of the pelvis was performed for planning purposes. Skin entry site was marked, and the overlying skin was prepped and draped in the standard sterile fashion. Local analgesia was obtained with 1% lidocaine. Using  fluoroscopic guidance, an 11 gauge needle was advanced just deep to the cortex of the right posterior ilium. Subsequently, bone marrow aspiration and core biopsy were performed. Specimens were submitted to lab/pathology for handling. Hemostasis was achieved with manual pressure, and a clean dressing was placed. The patient tolerated the procedure well without immediate complication. IMPRESSION: Successful bone marrow aspiration core biopsy of the posterior ilium using fluoroscopic guidance. Electronically Signed   By: Albin Felling M.D.   On: 11/05/2021 11:11     Assessment and plan- Patient is a 51 y.o. male here to discuss bone marrow biopsy results and further management  Patient presented with a hemoglobin of 21 about  2 months ago.  JAK2 mutation negative epo levels low normal.  Myeloid mutation panel negative.  It only showed 1 variant of unknown clinical significance.  Bone marrow biopsy does not show any evidence of myeloproliferative neoplasm.  Aspirate material however was hemodiluted with lack of optimal particles and the findings were overall not diagnostic for myeloid neoplasm.  Cause of polycythemia presently remains unclear.  I will send the patient to [facility] hematology for second opinion but continue to perform phlebotomies to keep hematocrit less than 50.  He does not want a phlebotomy today and he will come for weekly H&H and possible phlebotomy starting next week and I will see him back in 3 months  Typically EPO levels should be higher in patients with secondary polycythemia.  There may be a component of obstructive sleep apnea although patient is not overtly obese.  He does not wish to undergo sleep study at this time   Visit Diagnosis 1. Secondary polycythemia   2. Polycythemia      Dr. Randa Evens, MD, MPH [facility] [phone] 11/15/2021 2:20 PM

## 2021-11-17 ENCOUNTER — Other Ambulatory Visit: Payer: Self-pay

## 2021-11-17 DIAGNOSIS — D751 Secondary polycythemia: Secondary | ICD-10-CM

## 2021-11-18 ENCOUNTER — Inpatient Hospital Stay: Payer: Commercial Managed Care - HMO

## 2021-11-19 ENCOUNTER — Telehealth: Payer: Self-pay | Admitting: Oncology

## 2021-11-19 NOTE — Telephone Encounter (Signed)
Called patient to follow-up after missing his lab/phleb on 6/6. Patient stated that he would not be able to have weekly appointments due to work and would like to get a "second opinion". I offered patient the possibility of a modified schedule (would have to check with clinical team) and he was unsure. Patient stated that he would think about it. RN is aware and I will reach back out to patient at the end of the week.

## 2021-11-27 ENCOUNTER — Other Ambulatory Visit: Payer: Self-pay | Admitting: Oncology

## 2021-12-02 ENCOUNTER — Telehealth: Payer: Self-pay | Admitting: *Deleted

## 2021-12-02 NOTE — Telephone Encounter (Signed)
Robin sent a media message that :REFERRAL REQUEST DENIED- [facility] sent to the chart. Patient refused appt for the referral.

## 2021-12-27 ENCOUNTER — Other Ambulatory Visit: Payer: Self-pay | Admitting: Oncology

## 2021-12-29 ENCOUNTER — Encounter: Payer: Self-pay | Admitting: Oncology

## 2022-01-01 ENCOUNTER — Telehealth: Payer: Self-pay

## 2022-01-01 NOTE — Telephone Encounter (Signed)
I called [facility] to see if they accepted [place] insurance the young lady told me she wasn't able to see that. After speaking with Barbara Cower from [place] he stated that [facility] is in network for them.

## 2022-01-14 ENCOUNTER — Ambulatory Visit: Payer: Commercial Managed Care - HMO | Admitting: Internal Medicine

## 2022-01-15 ENCOUNTER — Telehealth: Payer: Self-pay

## 2022-01-15 NOTE — Telephone Encounter (Signed)
Reached out to [facility] in regards to pts referral for Polycythemia and per rep they have tried multiple times to contact pt to schedule and pt has not been able to be reached. I reached out to the pt and per pt he has not receieved any calls. I tried to provide pt with phone number and he stated he was "driving". Advised pt I will have [facility] reach out ASAP in order to schedule. Per Vernona Rieger at [place] they will try one more time to reach pt as they have sent letters and pt is also able to return their call.

## 2022-01-31 ENCOUNTER — Other Ambulatory Visit: Payer: Self-pay | Admitting: Oncology

## 2022-02-02 ENCOUNTER — Encounter: Payer: Self-pay | Admitting: Oncology

## 2022-02-17 ENCOUNTER — Inpatient Hospital Stay: Payer: Commercial Managed Care - HMO | Admitting: Oncology

## 2022-02-17 ENCOUNTER — Inpatient Hospital Stay: Payer: Commercial Managed Care - HMO | Attending: Oncology

## 2022-02-28 ENCOUNTER — Other Ambulatory Visit: Payer: Self-pay | Admitting: Oncology

## 2022-03-02 ENCOUNTER — Encounter: Payer: Self-pay | Admitting: Oncology

## 2022-04-01 ENCOUNTER — Other Ambulatory Visit: Payer: Self-pay | Admitting: Oncology

## 2023-03-09 ENCOUNTER — Emergency Department: Payer: Self-pay

## 2023-03-09 ENCOUNTER — Emergency Department
Admission: EM | Admit: 2023-03-09 | Discharge: 2023-03-09 | Disposition: A | Discharge status: Home / Self Care | Payer: Self-pay | Attending: Student in an Organized Health Care Education/Training Program | Admitting: Student in an Organized Health Care Education/Training Program

## 2023-03-09 ENCOUNTER — Other Ambulatory Visit: Payer: Self-pay

## 2023-03-09 ENCOUNTER — Encounter: Payer: Self-pay | Admitting: Oncology

## 2023-03-09 DIAGNOSIS — R066 Hiccough: Secondary | ICD-10-CM | POA: Insufficient documentation

## 2023-03-09 DIAGNOSIS — R112 Nausea with vomiting, unspecified: Secondary | ICD-10-CM | POA: Insufficient documentation

## 2023-03-09 LAB — CBC
HCT: 54.6 % — ABNORMAL HIGH (ref 39.0–52.0)
Hemoglobin: 18.6 g/dL — ABNORMAL HIGH (ref 13.0–17.0)
MCH: 32.9 pg (ref 26.0–34.0)
MCHC: 34.1 g/dL (ref 30.0–36.0)
MCV: 96.6 fL (ref 80.0–100.0)
Platelets: 194 10*3/uL (ref 150–400)
RBC: 5.65 MIL/uL (ref 4.22–5.81)
RDW: 14 % (ref 11.5–15.5)
WBC: 7.7 10*3/uL (ref 4.0–10.5)
nRBC: 0 % (ref 0.0–0.2)

## 2023-03-09 LAB — BASIC METABOLIC PANEL
Anion gap: 9 (ref 5–15)
BUN: 8 mg/dL (ref 6–20)
CO2: 27 mmol/L (ref 22–32)
Calcium: 8.5 mg/dL — ABNORMAL LOW (ref 8.9–10.3)
Chloride: 100 mmol/L (ref 98–111)
Creatinine, Ser: 0.91 mg/dL (ref 0.61–1.24)
GFR, Estimated: >60 mL/min (ref >60)
Glucose, Bld: 125 mg/dL — ABNORMAL HIGH (ref 70–99)
Potassium: 4.6 mmol/L (ref 3.5–5.1)
Sodium: 136 mmol/L (ref 135–145)

## 2023-03-09 LAB — HEPATIC FUNCTION PANEL
ALT: 21 U/L (ref 0–44)
AST: 24 U/L (ref 15–41)
Albumin: 3.4 g/dL — ABNORMAL LOW (ref 3.5–5.0)
Alkaline Phosphatase: 108 U/L (ref 38–126)
Bilirubin, Direct: 0.2 mg/dL (ref 0.0–0.2)
Indirect Bilirubin: 0.7 mg/dL (ref 0.3–0.9)
Total Bilirubin: 0.9 mg/dL (ref 0.3–1.2)
Total Protein: 7.2 g/dL (ref 6.5–8.1)

## 2023-03-09 LAB — TROPONIN I (HIGH SENSITIVITY)
Troponin I (High Sensitivity): 5 ng/L (ref <18)
Troponin I (High Sensitivity): 5 ng/L (ref <18)

## 2023-03-09 LAB — LIPASE, BLOOD: Lipase: 45 U/L (ref 11–51)

## 2023-03-09 MED ORDER — PANTOPRAZOLE SODIUM 40 MG IV SOLR
40.0000 mg | Freq: Once | INTRAVENOUS | Status: AC
  Administered 2023-03-09: 40 mg via INTRAVENOUS
  Filled 2023-03-09: qty 10

## 2023-03-09 MED ORDER — METOCLOPRAMIDE HCL 5 MG/ML IJ SOLN
10.0000 mg | Freq: Once | INTRAMUSCULAR | Status: AC
  Administered 2023-03-09: 10 mg via INTRAVENOUS
  Filled 2023-03-09: qty 2

## 2023-03-09 MED ORDER — PANTOPRAZOLE SODIUM 20 MG PO TBEC: 20.0000 mg | DELAYED_RELEASE_TABLET | Freq: Every day | ORAL | 1 refills | Status: DC

## 2023-03-09 MED ORDER — METOCLOPRAMIDE HCL 10 MG PO TABS: 10.0000 mg | ORAL_TABLET | Freq: Four times a day (QID) | ORAL | 1 refills | Status: AC | PRN

## 2023-03-09 NOTE — ED Provider Notes (Signed)
[facility] Provider Note    None    (approximate)   History   Hiccups   HPI  Jeffrey House is a 52 y.o. male who presents to the ER for evaluation of hiccups and nausea and vomiting for past few days.  Remote history of alcohol abuse.  Has history of GERD.  Not taking any Pepcid currently.  States that hiccups have been persistent enough that feels like it is catching his breath.  Denies any chest pain or shortness of breath at this time.     Physical Exam   Triage Vital Signs: ED Triage Vitals [03/09/23 1857]  Encounter Vitals Group     BP (!) 156/99     Systolic BP Percentile      Diastolic BP Percentile      Pulse Rate 96     Resp 20     Temp 98.7 F (37.1 C)     Temp src      SpO2 100 %     Weight 165 lb (74.8 kg)     Height 5\' 10"  (1.778 m)     Head Circumference      Peak Flow      Pain Score 0     Pain Loc      Pain Education      Exclude from Growth Chart     Most recent vital signs: Vitals:   03/09/23 1857  BP: (!) 156/99  Pulse: 96  Resp: 20  Temp: 98.7 F (37.1 C)  SpO2: 100%     Constitutional: Alert  Eyes: Conjunctivae are normal.  Head: Atraumatic. Nose: No congestion/rhinnorhea. Mouth/Throat: Mucous membranes are moist.   Neck: Painless ROM.  Cardiovascular:   Good peripheral circulation. Respiratory: Normal respiratory effort.  No retractions.  Gastrointestinal: Soft and nontender.  Musculoskeletal:  no deformity Neurologic:  MAE spontaneously. No gross focal neurologic deficits are appreciated.  Skin:  Skin is warm, dry and intact. No rash noted. Psychiatric: Mood and affect are normal. Speech and behavior are normal.    ED Results / Procedures / Treatments   Labs (all labs ordered are listed, but only abnormal results are displayed) Labs Reviewed  CBC - Abnormal; Notable for the following components:      Result Value   Hemoglobin 18.6 (*)    HCT 54.6 (*)    All other components within  normal limits  BASIC METABOLIC PANEL - Abnormal; Notable for the following components:   Glucose, Bld 125 (*)    Calcium 8.5 (*)    All other components within normal limits  HEPATIC FUNCTION PANEL - Abnormal; Notable for the following components:   Albumin 3.4 (*)    All other components within normal limits  LIPASE, BLOOD  TROPONIN I (HIGH SENSITIVITY)  TROPONIN I (HIGH SENSITIVITY)     EKG  ED ECG REPORT I, Willy Eddy, the attending physician, personally viewed and interpreted this ECG.   Date: 03/09/2023  EKG Time: 19:07  Rate: 95  Rhythm: sinus  Axis: normal  Intervals: normal  ST&T Change: no stemi    RADIOLOGY Please see ED Course for my review and interpretation.  I personally reviewed all radiographic images ordered to evaluate for the above acute complaints and reviewed radiology reports and findings.  These findings were personally discussed with the patient.  Please see medical record for radiology report.    PROCEDURES:  Critical Care performed: No  Procedures   MEDICATIONS ORDERED IN ED: Medications  metoCLOPramide (  REGLAN) injection 10 mg (10 mg Intravenous Given 03/09/23 2224)  pantoprazole (PROTONIX) injection 40 mg (40 mg Intravenous Given 03/09/23 2221)     IMPRESSION / MDM / ASSESSMENT AND PLAN / ED COURSE  I reviewed the triage vital signs and the nursing notes.                              Differential diagnosis includes, but is not limited to, electrolyte abnormality, gastroparesis, hiccups about, mass, GERD, SBO  Patient presenting to the ER for evaluation of symptoms as described above.  Based on symptoms, risk factors and considered above differential, this presenting complaint could reflect a potentially life-threatening illness therefore the patient will be placed on continuous pulse oximetry and telemetry for monitoring.  Laboratory evaluation will be sent to evaluate for the above complaints.      Clinical Course as of  03/09/23 2324  Tue Mar 09, 2023  2212 Chest x-ray on my review and interpretation without evidence of mass or consolidation. [PR]  2254 X-ray my review and interpretation without evidence of obstructive pattern. [PR]    Clinical Course User Index [PR] Willy Eddy, MD   Blood work is reassuring.  Patient has experienced some improvement.  Doubt cardiac etiology.  Does appear appropriate for outpatient management and follow-up.  Discussed strict return precautions.  FINAL CLINICAL IMPRESSION(S) / ED DIAGNOSES   Final diagnoses:  Hiccups     Rx / DC Orders   ED Discharge Orders          Ordered    metoCLOPramide (REGLAN) 10 MG tablet  Every 6 hours PRN        03/09/23 2312    pantoprazole (PROTONIX) 20 MG tablet  Daily        03/09/23 2312             Note:  This document was prepared using Dragon voice recognition software and may include unintentional dictation errors.    Willy Eddy, MD 03/09/23 [phone]

## 2023-03-09 NOTE — ED Triage Notes (Addendum)
Pt to ED For hiccups since yesterday. States sometimes its so strong feels like has to catch breath.  Reports thought it was acid reflux, was having "something come up" when bending over.  Reports "feels like somethings blocked in the middle". States hiccups came waking out of sleep

## 2023-03-09 NOTE — ED Notes (Signed)
Patient transported to X-ray 

## 2023-03-19 IMAGING — XA IR BIOPSY AND ASPIRATION BONE MARROW
1 series · 1 of 1 positions shown · non-contrast
Comparison: none

INDICATION: polycythemia

[Series 1: vasc extremity · 1 of 1 slices shown]
[im 1/1]
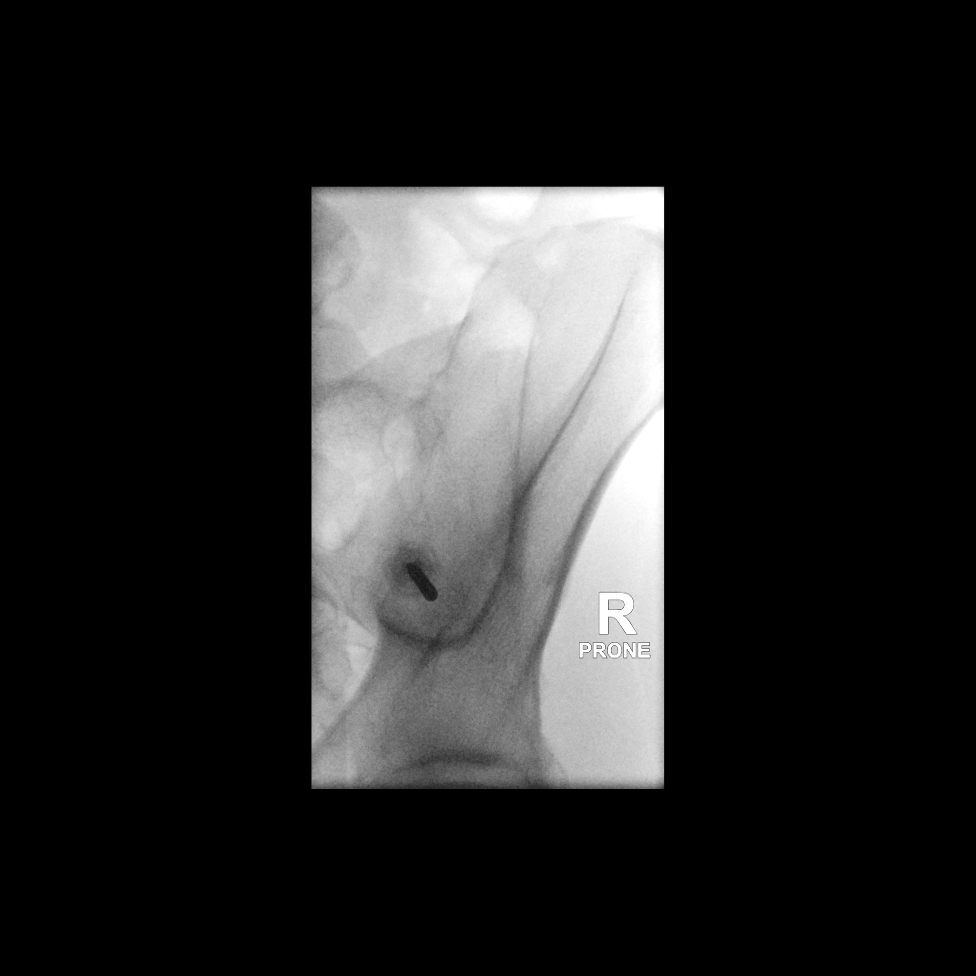

[1 of 1 positions shown; findings below may reference images not displayed]

EXAM:
IR [REDACTED] BIOPY AND ASPIRATION

MEDICATIONS:
None.

ANESTHESIA/SEDATION:
Moderate (conscious) sedation was employed during this procedure. A
total of Versed 1 mg and Fentanyl 50 mcg was administered
intravenously.

Moderate Sedation Time: 10 minutes. The patient's level of
consciousness and vital signs were monitored continuously by
radiology nursing throughout the procedure under my direct
supervision.

FLUOROSCOPY TIME:  Fluoroscopy Time: 1.0 (15 mGy

COMPLICATIONS:
None immediate.

PROCEDURE:
Informed written consent was obtained from the patient after a
thorough discussion of the procedural risks, benefits and
alternatives. All questions were addressed. Maximal Sterile Barrier
Technique was utilized including caps, mask, sterile gowns, sterile
gloves, sterile drape, hand hygiene and skin antiseptic. A timeout
was performed prior to the initiation of the procedure.

The patient was placed prone on the IR exam table. Limited
fluoroscopy of the pelvis was performed for planning purposes. Skin
entry site was marked, and the overlying skin was prepped and draped
in the standard sterile fashion. Local analgesia was obtained with
1% lidocaine. Using fluoroscopic guidance, an 11 gauge needle was
advanced just deep to the cortex of the right posterior ilium.
Subsequently, bone marrow aspiration and core biopsy were performed.
Specimens were submitted to lab/pathology for handling. Hemostasis
was achieved with manual pressure, and a clean dressing was placed.
The patient tolerated the procedure well without immediate
complication.
IMPRESSION: Successful bone marrow aspiration core biopsy of the posterior ilium
using fluoroscopic guidance.

## 2023-06-17 ENCOUNTER — Encounter: Payer: Self-pay | Admitting: Oncology

## 2023-06-17 NOTE — Telephone Encounter (Signed)
 Created in error

## 2023-06-23 DIAGNOSIS — R066 Hiccough: Secondary | ICD-10-CM | POA: Diagnosis not present

## 2023-08-29 NOTE — Progress Notes (Unsigned)
 New patient visit  Patient: Jeffrey House   DOB: 1970/09/16   53 y.o. Male  MRN: [id number] Visit Date: 09/02/2023  Today's healthcare provider: Debera Lat, PA-C   No chief complaint on file.  Subjective    Jeffrey House is a 53 y.o. male who presents today as a new patient to establish care.  HPI  *** Discussed the use of AI scribe software for clinical note transcription with the patient, who gave verbal consent to proceed.  History of Present Illness            Past Medical History:  Diagnosis Date  . Hypertension    Past Surgical History:  Procedure Laterality Date  . CLAVICLE SURGERY Right   . IR BONE MARROW BIOPSY & ASPIRATION  11/05/2021   Family Status  Relation Name Status  . Mother  (Not Specified)  . Sister  (Not Specified)  No partnership data on file   Family History  Problem Relation Age of Onset  . Hypertension Mother   . Hypertension Sister    Social History   Socioeconomic History  . Marital status: Married    Spouse name: Not on file  . Number of children: Not on file  . Years of education: Not on file  . Highest education level: Not on file  Occupational History  . Not on file  Tobacco Use  . Smoking status: Every Day    Current packs/day: 0.25    Types: Cigarettes  . Smokeless tobacco: Never  Substance and Sexual Activity  . Alcohol use: Yes    Comment: 1 drink in the past 2 weeks.  . Drug use: Not on file  . Sexual activity: Not on file  Other Topics Concern  . Not on file  Social History Narrative  . Not on file   Social Drivers of Health   Financial Resource Strain: Not on file  Food Insecurity: Not on file  Transportation Needs: Not on file  Physical Activity: Not on file  Stress: Not on file  Social Connections: Not on file   Outpatient Medications Prior to Visit  Medication Sig  . amLODipine (NORVASC) 5 MG tablet Take 1 tablet (5 mg total) by mouth daily.  . Aspirin 325 MG CAPS Take by mouth.  .  metoCLOPramide (REGLAN) 10 MG tablet Take 1 tablet (10 mg total) by mouth every 6 (six) hours as needed for nausea (hiccups).  . pantoprazole (PROTONIX) 20 MG tablet Take 1 tablet (20 mg total) by mouth daily.   No facility-administered medications prior to visit.   Allergies  Allergen Reactions  . Asa [Aspirin]   . Latex      There is no immunization history on file for this patient.  Health Maintenance  Topic Date Due  . Pneumococcal Vaccine 70-18 Years old (1 of 2 - PCV) Never done  . HIV Screening  Never done  . Hepatitis C Screening  Never done  . DTaP/Tdap/Td (1 - Tdap) Never done  . Colonoscopy  Never done  . Zoster Vaccines- Shingrix (1 of 2) Never done  . INFLUENZA VACCINE  Never done  . COVID-19 Vaccine (1 - 2024-25 season) Never done  . HPV VACCINES  Aged Out    Patient Care Team: Pcp, No as PCP - General Creig Hines, MD as Consulting Physician (Oncology)  Review of Systems  All other systems reviewed and are negative. Except see HPI   {Insert previous labs (optional):23779} {See past labs  Heme  Chem  Endocrine  Serology  Results Review (optional):1}   Objective    There were no vitals taken for this visit. {Insert last BP/Wt (optional):23777}{See vitals history (optional):1}   Physical Exam Vitals reviewed.  Constitutional:      General: He is not in acute distress.    Appearance: Normal appearance. He is not diaphoretic.  HENT:     Head: Normocephalic and atraumatic.  Eyes:     General: No scleral icterus.    Conjunctiva/sclera: Conjunctivae normal.  Cardiovascular:     Rate and Rhythm: Normal rate and regular rhythm.     Pulses: Normal pulses.     Heart sounds: Normal heart sounds. No murmur heard. Pulmonary:     Effort: Pulmonary effort is normal. No respiratory distress.     Breath sounds: Normal breath sounds. No wheezing or rhonchi.  Musculoskeletal:     Cervical back: Neck supple.     Right lower leg: No edema.     Left lower  leg: No edema.  Lymphadenopathy:     Cervical: No cervical adenopathy.  Skin:    General: Skin is warm and dry.     Findings: No rash.  Neurological:     Mental Status: He is alert and oriented to person, place, and time. Mental status is at baseline.  Psychiatric:        Mood and Affect: Mood normal.        Behavior: Behavior normal.   Depression Screen     No data to display         No results found for any visits on 09/02/23.  Assessment & Plan     *** Assessment and Plan              Encounter to establish care Welcomed to our clinic Reviewed past medical hx, social hx, family hx and surgical hx Pt advised to send all vaccination records or screening   No follow-ups on file.    The patient was advised to call back or seek an in-person evaluation if the symptoms worsen or if the condition fails to improve as anticipated.  I discussed the assessment and treatment plan with the patient. The patient was provided an opportunity to ask questions and all were answered. The patient agreed with the plan and demonstrated an understanding of the instructions.  I, Debera Lat, PA-C have reviewed all documentation for this visit. The documentation on  09/02/2023   for the exam, diagnosis, procedures, and orders are all accurate and complete.  Debera Lat, [facility], MMS [facility] [phone] (phone) [phone] (fax)  [facility]

## 2023-09-02 ENCOUNTER — Encounter: Payer: Self-pay | Admitting: Physician Assistant

## 2023-09-02 ENCOUNTER — Encounter: Payer: Self-pay | Admitting: Oncology

## 2023-09-02 ENCOUNTER — Ambulatory Visit: Payer: Self-pay | Admitting: Physician Assistant

## 2023-09-02 VITALS — BP 162/113 | HR 66 | Temp 98.1°F | Resp 16 | Ht 70.0 in | Wt 156.9 lb

## 2023-09-02 DIAGNOSIS — I10 Essential (primary) hypertension: Secondary | ICD-10-CM

## 2023-09-02 DIAGNOSIS — D751 Secondary polycythemia: Secondary | ICD-10-CM | POA: Diagnosis not present

## 2023-09-02 DIAGNOSIS — R066 Hiccough: Secondary | ICD-10-CM

## 2023-09-02 DIAGNOSIS — Z7689 Persons encountering health services in other specified circumstances: Secondary | ICD-10-CM

## 2023-09-02 DIAGNOSIS — Z1159 Encounter for screening for other viral diseases: Secondary | ICD-10-CM

## 2023-09-02 DIAGNOSIS — Z114 Encounter for screening for human immunodeficiency virus [HIV]: Secondary | ICD-10-CM

## 2023-09-02 DIAGNOSIS — K219 Gastro-esophageal reflux disease without esophagitis: Secondary | ICD-10-CM | POA: Diagnosis not present

## 2023-09-02 DIAGNOSIS — R0989 Other specified symptoms and signs involving the circulatory and respiratory systems: Secondary | ICD-10-CM

## 2023-09-02 DIAGNOSIS — Z136 Encounter for screening for cardiovascular disorders: Secondary | ICD-10-CM

## 2023-09-02 DIAGNOSIS — R1013 Epigastric pain: Secondary | ICD-10-CM

## 2023-09-02 DIAGNOSIS — F17200 Nicotine dependence, unspecified, uncomplicated: Secondary | ICD-10-CM

## 2023-09-02 DIAGNOSIS — IMO0001 Reserved for inherently not codable concepts without codable children: Secondary | ICD-10-CM | POA: Insufficient documentation

## 2023-09-02 MED ORDER — FAMOTIDINE 20 MG PO TABS
20.0000 mg | ORAL_TABLET | Freq: Two times a day (BID) | ORAL | 0 refills | Status: DC
Start: 2023-09-02 — End: 2023-09-24

## 2023-09-02 MED ORDER — OMEPRAZOLE 40 MG PO CPDR
40.0000 mg | DELAYED_RELEASE_CAPSULE | Freq: Every day | ORAL | 3 refills | Status: AC
Start: 2023-09-02 — End: ?

## 2023-09-02 MED ORDER — AMLODIPINE BESYLATE 5 MG PO TABS
5.0000 mg | ORAL_TABLET | Freq: Every day | ORAL | 0 refills | Status: AC
Start: 2023-09-02 — End: ?

## 2023-09-02 MED ORDER — HYDROCHLOROTHIAZIDE 12.5 MG PO CAPS
12.5000 mg | ORAL_CAPSULE | Freq: Every day | ORAL | 1 refills | Status: DC
Start: 2023-09-02 — End: 2023-09-24

## 2023-09-04 LAB — CBC WITH DIFFERENTIAL/PLATELET
Basophils Absolute: 0 10*3/uL (ref 0.0–0.2)
Basos: 1 %
EOS (ABSOLUTE): 0.2 10*3/uL (ref 0.0–0.4)
Eos: 4 %
Hematocrit: 56.2 % — ABNORMAL HIGH (ref 37.5–51.0)
Hemoglobin: 19.3 g/dL — ABNORMAL HIGH (ref 13.0–17.7)
Immature Grans (Abs): 0 10*3/uL (ref 0.0–0.1)
Immature Granulocytes: 0 %
Lymphocytes Absolute: 1.4 10*3/uL (ref 0.7–3.1)
Lymphs: 23 %
MCH: 32.6 pg (ref 26.6–33.0)
MCHC: 34.3 g/dL (ref 31.5–35.7)
MCV: 95 fL (ref 79–97)
Monocytes Absolute: 0.5 10*3/uL (ref 0.1–0.9)
Monocytes: 9 %
Neutrophils Absolute: 3.9 10*3/uL (ref 1.4–7.0)
Neutrophils: 63 %
Platelets: 247 10*3/uL (ref 150–450)
RBC: 5.92 x10E6/uL — ABNORMAL HIGH (ref 4.14–5.80)
RDW: 12.2 % (ref 11.6–15.4)
WBC: 6 10*3/uL (ref 3.4–10.8)

## 2023-09-04 LAB — LIPID PANEL
Chol/HDL Ratio: 3 ratio (ref 0.0–5.0)
Cholesterol, Total: 244 mg/dL — ABNORMAL HIGH (ref 100–199)
HDL: 81 mg/dL (ref >39)
LDL Chol Calc (NIH): 151 mg/dL — ABNORMAL HIGH (ref 0–99)
Triglycerides: 72 mg/dL (ref 0–149)
VLDL Cholesterol Cal: 12 mg/dL (ref 5–40)

## 2023-09-04 LAB — COMPREHENSIVE METABOLIC PANEL
ALT: 18 IU/L (ref 0–44)
AST: 21 IU/L (ref 0–40)
Albumin: 3.8 g/dL (ref 3.8–4.9)
Alkaline Phosphatase: 149 IU/L — ABNORMAL HIGH (ref 44–121)
BUN/Creatinine Ratio: 7 — ABNORMAL LOW (ref 9–20)
BUN: 6 mg/dL (ref 6–24)
Bilirubin Total: 0.7 mg/dL (ref 0.0–1.2)
CO2: 26 mmol/L (ref 20–29)
Calcium: 9.4 mg/dL (ref 8.7–10.2)
Chloride: 101 mmol/L (ref 96–106)
Creatinine, Ser: 0.89 mg/dL (ref 0.76–1.27)
Globulin, Total: 2.8 g/dL (ref 1.5–4.5)
Glucose: 90 mg/dL (ref 70–99)
Potassium: 4.2 mmol/L (ref 3.5–5.2)
Sodium: 143 mmol/L (ref 134–144)
Total Protein: 6.6 g/dL (ref 6.0–8.5)
eGFR: 102 mL/min/{1.73_m2} (ref >59)

## 2023-09-04 LAB — LIPASE: Lipase: 40 U/L (ref 13–78)

## 2023-09-04 LAB — TSH: TSH: 0.539 u[IU]/mL (ref 0.450–4.500)

## 2023-09-04 LAB — HEPATITIS C ANTIBODY: Hep C Virus Ab: NONREACTIVE

## 2023-09-04 LAB — HIV ANTIBODY (ROUTINE TESTING W REFLEX): HIV Screen 4th Generation wRfx: NONREACTIVE

## 2023-09-06 ENCOUNTER — Other Ambulatory Visit: Payer: Self-pay | Admitting: Physician Assistant

## 2023-09-06 ENCOUNTER — Telehealth: Payer: Self-pay

## 2023-09-06 DIAGNOSIS — E782 Mixed hyperlipidemia: Secondary | ICD-10-CM

## 2023-09-06 MED ORDER — ROSUVASTATIN CALCIUM 5 MG PO TABS
5.0000 mg | ORAL_TABLET | Freq: Every day | ORAL | 1 refills | Status: DC
Start: 2023-09-06 — End: 2024-03-06

## 2023-09-06 NOTE — Progress Notes (Signed)
 Your lab results are stable for you except elevated cholesterol level. The 10-year ASCVD risk score (risk for heart attack and stroke) is: 24.1%.  Advised start taking cholesterol medication, it will be called in to your pharmacy Will monitor for potential adverse effects, especially muscle-related symptoms and liver enzyme elevations

## 2023-09-16 ENCOUNTER — Encounter: Payer: Self-pay | Admitting: Physician Assistant

## 2023-09-16 ENCOUNTER — Ambulatory Visit: Admitting: Physician Assistant

## 2023-09-16 VITALS — BP 134/87 | HR 93 | Resp 16 | Ht 70.0 in | Wt 155.0 lb

## 2023-09-16 DIAGNOSIS — R066 Hiccough: Secondary | ICD-10-CM | POA: Diagnosis not present

## 2023-09-16 DIAGNOSIS — K219 Gastro-esophageal reflux disease without esophagitis: Secondary | ICD-10-CM

## 2023-09-16 DIAGNOSIS — M25562 Pain in left knee: Secondary | ICD-10-CM

## 2023-09-16 DIAGNOSIS — J302 Other seasonal allergic rhinitis: Secondary | ICD-10-CM | POA: Insufficient documentation

## 2023-09-16 DIAGNOSIS — E782 Mixed hyperlipidemia: Secondary | ICD-10-CM | POA: Insufficient documentation

## 2023-09-16 DIAGNOSIS — F17290 Nicotine dependence, other tobacco product, uncomplicated: Secondary | ICD-10-CM

## 2023-09-16 DIAGNOSIS — D751 Secondary polycythemia: Secondary | ICD-10-CM

## 2023-09-16 DIAGNOSIS — F17200 Nicotine dependence, unspecified, uncomplicated: Secondary | ICD-10-CM | POA: Insufficient documentation

## 2023-09-16 DIAGNOSIS — R0981 Nasal congestion: Secondary | ICD-10-CM

## 2023-09-16 DIAGNOSIS — I10 Essential (primary) hypertension: Secondary | ICD-10-CM

## 2023-09-16 DIAGNOSIS — G8929 Other chronic pain: Secondary | ICD-10-CM | POA: Insufficient documentation

## 2023-09-16 NOTE — Progress Notes (Signed)
 Established patient visit  Patient: Jeffrey House   DOB: 1971/03/14   53 y.o. Male  MRN: [id number] Visit Date: 09/16/2023  Today's healthcare provider: Debera Lat, PA-C   Chief Complaint  Patient presents with   Follow-up    2 wk f/u   Subjective      Discussed the use of AI scribe software for clinical note transcription with the patient, who gave verbal consent to proceed.  History of Present Illness The patient, with a history of acid reflux, hypertension, high cholesterol, and a blood disorder, presents for a follow-up visit. The patient reports improvement in acid reflux symptoms and a decrease in hiccups. However, the patient has not yet been able to schedule an appointment with a gastroenterologist, despite a referral being placed. Per chart review, pt was scheduled with [facility] for 01/13/24 at 1:30pm. The patient also reports a smoking habit of half a pack per day,  upfrom previous levels. The patient is attempting to quit smoking and has been using nicotine patches. The patient is currently taking amlodipine and a diuretic for hypertension, and has recently started taking Crestor for high cholesterol. The patient also reports a chronic issue with the left knee, for which an orthopedic surgeon has recommended an injection. However, he may need a referral to ortho Labs showed increased cholesterol level and pt started taking cholesterol med on 09/06/23. Denies having side effects      09/16/2023    9:13 AM 09/02/2023    9:59 AM  Depression screen PHQ 2/9  Decreased Interest 0 0  Down, Depressed, Hopeless 0 0  PHQ - 2 Score 0 0  Altered sleeping 0 0  Tired, decreased energy 0 0  Change in appetite 0 0  Feeling bad or failure about yourself  0 0  Trouble concentrating 0 0  Moving slowly or fidgety/restless 0 0  Suicidal thoughts 0 0  PHQ-9 Score 0 0  Difficult doing work/chores Not difficult at all Not difficult at all      09/16/2023    9:13 AM 09/02/2023     9:59 AM  GAD 7 : Generalized Anxiety Score  Nervous, Anxious, on Edge 0 0  Control/stop worrying 0 1  Worry too much - different things 0 1  Trouble relaxing 0 0  Restless 0 0  Easily annoyed or irritable 0 0  Afraid - awful might happen 0 0  Total GAD 7 Score 0 2  Anxiety Difficulty  Not difficult at all    Medications: Outpatient Medications Prior to Visit  Medication Sig   amLODipine (NORVASC) 5 MG tablet Take 1 tablet (5 mg total) by mouth daily.   Aspirin 325 MG CAPS Take by mouth.   famotidine (PEPCID) 20 MG tablet Take 1 tablet (20 mg total) by mouth 2 (two) times daily.   hydrochlorothiazide (MICROZIDE) 12.5 MG capsule Take 1 capsule (12.5 mg total) by mouth daily.   metoCLOPramide (REGLAN) 10 MG tablet Take 1 tablet (10 mg total) by mouth every 6 (six) hours as needed for nausea (hiccups).   omeprazole (PRILOSEC) 40 MG capsule Take 1 capsule (40 mg total) by mouth daily.   rosuvastatin (CRESTOR) 5 MG tablet Take 1 tablet (5 mg total) by mouth daily.   No facility-administered medications prior to visit.    Review of Systems  All other systems reviewed and are negative.  All negative Except see HPI       Objective    BP 134/87 (BP Location: Left Arm, Patient  Position: Sitting)   Pulse 93   Resp 16   Ht 5\' 10"  (1.778 m)   Wt 155 lb (70.3 kg)   SpO2 100%   BMI 22.24 kg/m     Physical Exam Vitals reviewed.  Constitutional:      General: He is not in acute distress.    Appearance: Normal appearance. He is not diaphoretic.  HENT:     Head: Normocephalic and atraumatic.  Eyes:     General: No scleral icterus.    Conjunctiva/sclera: Conjunctivae normal.  Cardiovascular:     Rate and Rhythm: Normal rate and regular rhythm.     Pulses: Normal pulses.     Heart sounds: Normal heart sounds. No murmur heard. Pulmonary:     Effort: Pulmonary effort is normal. No respiratory distress.     Breath sounds: Normal breath sounds. No wheezing or rhonchi.   Musculoskeletal:     Cervical back: Neck supple.     Right lower leg: No edema.     Left lower leg: No edema.  Lymphadenopathy:     Cervical: No cervical adenopathy.  Skin:    General: Skin is warm and dry.     Findings: No rash.  Neurological:     Mental Status: He is alert and oriented to person, place, and time. Mental status is at baseline.  Psychiatric:        Mood and Affect: Mood normal.        Behavior: Behavior normal.      No results found for any visits on 09/16/23.      Assessment & Plan Gastroesophageal Reflux Disease (GERD)/hiccups GERD symptoms improved. Follow-up with gastroenterology scheduled. Emphasized dietary modifications. - Ensure attendance at gastroenterology appointment on January 13, 2024. Continue taking famotidine 20mg  BID - Reinforce dietary modifications. Will follow-up   Hypertension chronic Blood pressure 134 mmHg, above target. On amlodipine and diuretic. Discussed increasing amlodipine. Declined increase of a dose of amlodipine. Advised sodium reduction and regular monitoring. - Consider increasing amlodipine dosage if necessary. - Advise to reduce sodium intake. - Instruct to monitor blood pressure regularly. - Schedule follow-up in three months.  Hyperlipidemia Chronic and unstable Prescribed Crestor 5 mg. Emphasized importance for cardiovascular prevention. Advised evening intake and separation from other meds. - Ensure continuation of Crestor 5 mg daily. - Recheck lab work at next visit. Will follow-up  Polycythemia chronic Has not seen hematologist in over a year. Needs phlebotomy and management. Emphasized importance of follow-up. - Advise to reconnect with hematologist for phlebotomy and management. Was referred to [facility] hematology  Nicotine Dependence chronic Smoking half pack per day. Uses nicotine patches. Advised to contact quit smoking resources for support and supplies. - Advise to contact quit smoking resources for  free nicotine patches or gums. Will follow-up  Osteoarthritis of the Left Knee Chronic pain. Recommended knee injection by orthopedic surgeon. Discussed potential future surgery. - Advise to continue care with current orthopedic surgeon for knee injection.  Seasonal Allergies/nasal congestion Symptoms well-controlled. Advised nasal saline and Flonase. Consider azelastine and ipratropium if needed. - Recommend nasal saline rinse and Flonase. - Consider azelastine and ipratropium if symptoms persist.  General Health Maintenance Advised healthy lifestyle, exercise, and weight management. Discussed benefits for overall health and specific conditions. - Encourage regular exercise and weight management.  Follow-up Scheduled follow-up in three months post-gastroenterology appointment. Discussed importance of monitoring blood pressure and cholesterol. - Schedule follow-up visit in August 2025 after gastroenterology appointment.    No orders of the defined  types were placed in this encounter.   Return in about 18 weeks (around 01/20/2024) for chronic disease f/u.   The patient was advised to call back or seek an in-person evaluation if the symptoms worsen or if the condition fails to improve as anticipated.  I discussed the assessment and treatment plan with the patient. The patient was provided an opportunity to ask questions and all were answered. The patient agreed with the plan and demonstrated an understanding of the instructions.  I, Debera Lat, PA-C have reviewed all documentation for this visit. The documentation on 09/16/2023  for the exam, diagnosis, procedures, and orders are all accurate and complete.  Debera Lat, [facility], MMS [facility] [phone] (phone) [phone] (fax)  [facility]

## 2023-09-24 ENCOUNTER — Other Ambulatory Visit: Payer: Self-pay | Admitting: Physician Assistant

## 2023-09-24 DIAGNOSIS — K219 Gastro-esophageal reflux disease without esophagitis: Secondary | ICD-10-CM

## 2023-09-24 DIAGNOSIS — I10 Essential (primary) hypertension: Secondary | ICD-10-CM

## 2023-09-24 NOTE — Telephone Encounter (Signed)
 Refilling for 90 days.  Requested Prescriptions  Pending Prescriptions Disp Refills   hydrochlorothiazide (MICROZIDE) 12.5 MG capsule [Pharmacy Med Name: HYDROCHLOROTHIAZIDE 12.5 MG CP] 90 capsule 1    Sig: TAKE 1 CAPSULE BY MOUTH EVERY DAY     Cardiovascular: Diuretics - Thiazide Passed - 09/24/2023  2:47 PM      Passed - Cr in normal range and within 180 days    Creatinine, Ser  Date Value Ref Range Status  09/02/2023 0.89 0.76 - 1.27 mg/dL Final         Passed - K in normal range and within 180 days    Potassium  Date Value Ref Range Status  09/02/2023 4.2 3.5 - 5.2 mmol/L Final         Passed - Na in normal range and within 180 days    Sodium  Date Value Ref Range Status  09/02/2023 143 134 - 144 mmol/L Final         Passed - Last BP in normal range    BP Readings from Last 1 Encounters:  09/16/23 134/87         Passed - Valid encounter within last 6 months    Recent Outpatient Visits           1 week ago Primary hypertension   [place] [facility] [place], [place], PA-C   3 weeks ago Encounter to establish care   [facility] [place], [place], PA-C               famotidine (PEPCID) 20 MG tablet [Pharmacy Med Name: FAMOTIDINE 20 MG TABLET] 180 tablet 1    Sig: TAKE 1 TABLET BY MOUTH TWICE A DAY     Gastroenterology:  H2 Antagonists Passed - 09/24/2023  2:47 PM      Passed - Valid encounter within last 12 months    Recent Outpatient Visits           1 week ago Primary hypertension   [place] [facility] [place], [place], PA-C   3 weeks ago Encounter to establish care   [place] [facility] [place], [place], [place]

## 2023-10-01 ENCOUNTER — Emergency Department

## 2023-10-01 ENCOUNTER — Emergency Department
Admission: EM | Admit: 2023-10-01 | Discharge: 2023-10-01 | Disposition: A | Discharge status: Home / Self Care | Attending: Emergency Medicine | Admitting: Emergency Medicine

## 2023-10-01 ENCOUNTER — Other Ambulatory Visit: Payer: Self-pay

## 2023-10-01 DIAGNOSIS — R066 Hiccough: Secondary | ICD-10-CM | POA: Diagnosis not present

## 2023-10-01 DIAGNOSIS — K209 Esophagitis, unspecified without bleeding: Secondary | ICD-10-CM | POA: Diagnosis not present

## 2023-10-01 DIAGNOSIS — I10 Essential (primary) hypertension: Secondary | ICD-10-CM | POA: Insufficient documentation

## 2023-10-01 DIAGNOSIS — K224 Dyskinesia of esophagus: Secondary | ICD-10-CM | POA: Diagnosis not present

## 2023-10-01 LAB — COMPREHENSIVE METABOLIC PANEL WITH GFR
ALT: 32 U/L (ref 0–44)
AST: 26 U/L (ref 15–41)
Albumin: 3.8 g/dL (ref 3.5–5.0)
Alkaline Phosphatase: 104 U/L (ref 38–126)
Anion gap: 10 (ref 5–15)
BUN: 15 mg/dL (ref 6–20)
CO2: 25 mmol/L (ref 22–32)
Calcium: 9.3 mg/dL (ref 8.9–10.3)
Chloride: 99 mmol/L (ref 98–111)
Creatinine, Ser: 0.83 mg/dL (ref 0.61–1.24)
GFR, Estimated: >60 mL/min (ref >60)
Glucose, Bld: 116 mg/dL — ABNORMAL HIGH (ref 70–99)
Potassium: 4.7 mmol/L (ref 3.5–5.1)
Sodium: 134 mmol/L — ABNORMAL LOW (ref 135–145)
Total Bilirubin: 1.3 mg/dL — ABNORMAL HIGH (ref 0.0–1.2)
Total Protein: 7.4 g/dL (ref 6.5–8.1)

## 2023-10-01 LAB — CBC
HCT: 56.2 % — ABNORMAL HIGH (ref 39.0–52.0)
Hemoglobin: 19.1 g/dL — ABNORMAL HIGH (ref 13.0–17.0)
MCH: 32.4 pg (ref 26.0–34.0)
MCHC: 34 g/dL (ref 30.0–36.0)
MCV: 95.4 fL (ref 80.0–100.0)
Platelets: 157 10*3/uL (ref 150–400)
RBC: 5.89 MIL/uL — ABNORMAL HIGH (ref 4.22–5.81)
RDW: 13.1 % (ref 11.5–15.5)
WBC: 6.4 10*3/uL (ref 4.0–10.5)
nRBC: 0 % (ref 0.0–0.2)

## 2023-10-01 LAB — LIPASE, BLOOD: Lipase: 46 U/L (ref 11–51)

## 2023-10-01 MED ORDER — PANTOPRAZOLE SODIUM 40 MG PO TBEC: 40.0000 mg | DELAYED_RELEASE_TABLET | Freq: Every day | ORAL | 1 refills | Status: AC

## 2023-10-01 MED ORDER — SODIUM CHLORIDE 0.9 % IV BOLUS
1000.0000 mL | Freq: Once | INTRAVENOUS | Status: AC
  Administered 2023-10-01: 1000 mL via INTRAVENOUS

## 2023-10-01 MED ORDER — GLUCAGON HCL RDNA (DIAGNOSTIC) 1 MG IJ SOLR
1.0000 mg | Freq: Once | INTRAMUSCULAR | Status: AC
  Administered 2023-10-01: 1 mg via INTRAVENOUS
  Filled 2023-10-01: qty 1

## 2023-10-01 MED ORDER — SUCRALFATE 1 G PO TABS: 1.0000 g | ORAL_TABLET | Freq: Four times a day (QID) | ORAL | 0 refills | Status: DC

## 2023-10-01 NOTE — ED Notes (Signed)
 Pt tolerated po challenge and requests to leave despite imaging not being resulted. MD notified.

## 2023-10-01 NOTE — Discharge Instructions (Addendum)
 Please begin taking your Protonix  and sucralfate  as prescribed (instead of omeprazole /famotidine ).  Please call the number provided for GI medicine to arrange a follow-up appointment as soon as possible as you will likely need an endoscopy to further evaluate.  Return to the emergency department for any worsening symptoms or any other symptom personally concerning to yourself.

## 2023-10-01 NOTE — ED Provider Notes (Signed)
 [facility] Provider Note    Event Date/Time   First MD Initiated Contact with Patient 10/01/23 [phone]     (approximate)  History   Chief Complaint: Foreign body in throat  HPI  Jeffrey House is a 53 y.o. male with a past medical history of hypertension, gastric reflux, presents to the emergency department with a foreign body sensation in his esophagus.  According to the patient since Tuesday he has been feeling like food is getting stuck in his lower esophagus often times he will vomit after eating.  He states at times he feels like he can drink liquids and they will pass.  Patient states that is also causing him hiccups.  Patient states a history of reflux he has been using omeprazole  and famotidine .  Denies any history of esophageal obstruction in the past.  Has never had an endoscopy.  Physical Exam   Triage Vital Signs: ED Triage Vitals  Encounter Vitals Group     BP 10/01/23 0932 (!) 141/86     Systolic BP Percentile --      Diastolic BP Percentile --      Pulse Rate 10/01/23 0932 92     Resp 10/01/23 0932 18     Temp 10/01/23 0932 98 F (36.7 C)     Temp src --      SpO2 10/01/23 0932 99 %     Weight --      Height --      Head Circumference --      Peak Flow --      Pain Score 10/01/23 0931 5     Pain Loc --      Pain Education --      Exclude from Growth Chart --     Most recent vital signs: Vitals:   10/01/23 0932  BP: (!) 141/86  Pulse: 92  Resp: 18  Temp: 98 F (36.7 C)  SpO2: 99%    General: Awake, no distress.  CV:  Good peripheral perfusion.  Regular rate and rhythm  Resp:  Normal effort.  Equal breath sounds bilaterally.  Abd:  No distention.  Soft, nontender.   ED Results / Procedures / Treatments   MEDICATIONS ORDERED IN ED: Medications  sodium chloride  0.9 % bolus 1,000 mL (has no administration in time range)  glucagon  (GLUCAGEN ) injection 1 mg (has no administration in time range)     IMPRESSION / MDM /  ASSESSMENT AND PLAN / ED COURSE  I reviewed the triage vital signs and the nursing notes.  Patient's presentation is most consistent with acute presentation with potential threat to life or bodily function.  Patient presents to the emergency department for difficulty swallowing since Tuesday.  Differential would include esophageal obstruction, stricture, esophagitis from reflux, oncologic process.  We will treat with fluids and glucagon  we will check labs and closely monitor.  After trial of glucagon  we will attempt to have the patient drink fluids to see if he is able to swallow.  If the patient is able to swallow he may benefit from a barium swallow to further evaluate if not, he may need an endoscopy.  I have reviewed and interpreted the barium swallow.  The barium appears to be passing into the stomach however does appear to be briefly pause at the GE junction and build in the distal esophagus before passing.  Awaiting official radiology read.  I suspect this is likely related to esophagitis.  We will place the patient on Protonix   and sucralfate .  We will have the patient follow-up with GI medicine as I believe the patient needs to have an endoscopy to rule out any type of mass or more concerning finding.  Awaiting official radiology read of the barium swallow patient care signed out to oncoming provider.  FINAL CLINICAL IMPRESSION(S) / ED DIAGNOSES   Esophagitis  Note:  This document was prepared using Dragon voice recognition software and may include unintentional dictation errors.   Dorothyann Drivers, MD 10/01/23 1511

## 2023-10-01 NOTE — ED Notes (Signed)
 Pt given juice and food for po challenge

## 2023-10-01 NOTE — ED Provider Notes (Signed)
-----------------------------------------   5:08 PM on 10/01/2023 -----------------------------------------   Blood pressure (!) 144/91, pulse 67, temperature 98 F (36.7 C), resp. rate 19, SpO2 98%.  Received signout on patient.  53 year old male presenting today for foreign body sensation in the esophagus.  Unsure if something is stuck or slow-moving.  No prior history of esophageal obstruction.  Laboratory workup was reassuring.  Previous provider did order x-ray with barium challenge.  Barium is passing into the stomach but may be having some slight pausing in the distal esophagus which appeared to be more likely related to esophagitis.  Patient did not want to wait for results any longer in the ED.  He was tolerating p.o. without any difficulty.  Did discuss with him that we did not have x-ray results back and he was okay going home at this time with GI follow-up.  No obvious need for acute intervention at this time but will have him follow-up with GI outpatient.  Given strict return precautions for worsening symptoms.   Kandee Orion, MD 10/01/23 [phone]

## 2023-10-01 NOTE — ED Triage Notes (Signed)
 Pt comes with c/o something stuck in his throat. Pt states he feels like his food isn't going down. Pt keeps burping in triage. Pt states some nausea. Pt states this started on Tuesday. Pt states he has been vomiting.

## 2023-10-05 NOTE — Telephone Encounter (Signed)
 Jeffrey House

## 2023-11-28 ENCOUNTER — Other Ambulatory Visit: Payer: Self-pay | Admitting: Physician Assistant

## 2023-11-28 DIAGNOSIS — K219 Gastro-esophageal reflux disease without esophagitis: Secondary | ICD-10-CM

## 2023-11-28 DIAGNOSIS — I10 Essential (primary) hypertension: Secondary | ICD-10-CM

## 2024-01-06 DIAGNOSIS — D751 Secondary polycythemia: Secondary | ICD-10-CM | POA: Diagnosis not present

## 2024-01-06 DIAGNOSIS — R1319 Other dysphagia: Secondary | ICD-10-CM | POA: Diagnosis not present

## 2024-01-06 DIAGNOSIS — R1013 Epigastric pain: Secondary | ICD-10-CM | POA: Diagnosis not present

## 2024-01-06 DIAGNOSIS — Z1211 Encounter for screening for malignant neoplasm of colon: Secondary | ICD-10-CM | POA: Diagnosis not present

## 2024-01-20 ENCOUNTER — Ambulatory Visit: Admission: RE | Admit: 2024-01-20 | Source: Home / Self Care | Admitting: Gastroenterology

## 2024-01-20 ENCOUNTER — Ambulatory Visit: Admitting: Physician Assistant

## 2024-01-20 SURGERY — COLONOSCOPY
Anesthesia: General

## 2024-03-04 ENCOUNTER — Other Ambulatory Visit: Payer: Self-pay | Admitting: Physician Assistant

## 2024-03-04 DIAGNOSIS — E782 Mixed hyperlipidemia: Secondary | ICD-10-CM

## 2024-03-07 ENCOUNTER — Other Ambulatory Visit: Payer: Self-pay | Admitting: Physician Assistant

## 2024-03-07 DIAGNOSIS — I10 Essential (primary) hypertension: Secondary | ICD-10-CM

## 2024-03-31 ENCOUNTER — Other Ambulatory Visit: Payer: Self-pay | Admitting: Physician Assistant

## 2024-03-31 DIAGNOSIS — K219 Gastro-esophageal reflux disease without esophagitis: Secondary | ICD-10-CM

## 2024-03-31 DIAGNOSIS — I10 Essential (primary) hypertension: Secondary | ICD-10-CM

## 2024-04-22 ENCOUNTER — Encounter: Payer: Self-pay | Admitting: Emergency Medicine

## 2024-04-22 ENCOUNTER — Other Ambulatory Visit: Payer: Self-pay

## 2024-04-22 ENCOUNTER — Emergency Department
Admission: EM | Admit: 2024-04-22 | Discharge: 2024-04-23 | Disposition: A | Discharge status: Home / Self Care | Attending: Emergency Medicine | Admitting: Emergency Medicine

## 2024-04-22 DIAGNOSIS — R066 Hiccough: Secondary | ICD-10-CM | POA: Insufficient documentation

## 2024-04-22 DIAGNOSIS — R09A9 Foreign body sensation, other site: Secondary | ICD-10-CM | POA: Diagnosis not present

## 2024-04-22 DIAGNOSIS — I10 Essential (primary) hypertension: Secondary | ICD-10-CM | POA: Diagnosis not present

## 2024-04-22 DIAGNOSIS — K209 Esophagitis, unspecified without bleeding: Secondary | ICD-10-CM

## 2024-04-22 HISTORY — DX: Esophagitis, unspecified without bleeding: K20.90

## 2024-04-22 MED ORDER — GLUCAGON HCL RDNA (DIAGNOSTIC) 1 MG IJ SOLR
1.0000 mg | Freq: Once | INTRAMUSCULAR | Status: DC | PRN
  Filled 2024-04-22: qty 1

## 2024-04-22 MED ORDER — SODIUM CHLORIDE 0.9 % IV BOLUS
500.0000 mL | Freq: Once | INTRAVENOUS | Status: AC
  Administered 2024-04-23: 500 mL via INTRAVENOUS

## 2024-04-22 NOTE — ED Provider Notes (Signed)
 [facility] Provider Note    Event Date/Time   First MD Initiated Contact with Patient 04/22/24 2314     (approximate)   History   Esophagitis   HPI  Zyden Suman is a 53 y.o. male  with a past medical history of septicemia, hypertension, GERD, esophagitis presents to the emergency department with feeling like he has a foreign body sensation in his esophagus.  Patient reports he has been hiccuping and feels like some food has been getting stuck in his chest, especially after eating.  He has had multiple episodes of vomiting after eating and drinking liquids the past 3 to 4 days.  Patient is currently taking omeprazole  for GERD.  He denies any pain in his chest or abdomen or difficulty breathing or back pain.  Review of chart shows patient was seen for a similar concern on October 01, 2023 in the ER. Followed up with gastroenterology on 01-06-2024 with need for screening colonoscopy and EGD, but patient reports he refused both procedures.  Physical Exam   Triage Vital Signs: ED Triage Vitals  Encounter Vitals Group     BP 04/22/24 2236 (!) 154/100     Girls Systolic BP Percentile --      Girls Diastolic BP Percentile --      Boys Systolic BP Percentile --      Boys Diastolic BP Percentile --      Pulse Rate 04/22/24 2236 (!) 101     Resp 04/22/24 2236 17     Temp 04/22/24 2236 97.9 F (36.6 C)     Temp Source 04/22/24 2236 Oral     SpO2 04/22/24 2236 97 %     Weight 04/22/24 2234 155 lb (70.3 kg)     Height 04/22/24 2234 5' 10 (1.778 m)     Head Circumference --      Peak Flow --      Pain Score 04/22/24 2234 0     Pain Loc --      Pain Education --      Exclude from Growth Chart --     Most recent vital signs: Vitals:   04/22/24 2236  BP: (!) 154/100  Pulse: (!) 101  Resp: 17  Temp: 97.9 F (36.6 C)  SpO2: 97%    General: Awake, in no acute distress. Continuous hiccuping on exam. Ears/Nose/Throat: TMs intact b/l. Nares patent, no  nasal discharge. Oropharynx moist, no erythema or exudate. Dentition intact. Neck: Supple, no lymphadenopathy. CV: Good peripheral perfusion. Tachy at 101 bpm. Respiratory:Normal respiratory effort.  No respiratory distress. CTAB. GI: Soft, non-distended, non-tender.  Skin:Warm, dry, intact.  ED Results / Procedures / Treatments   Labs (all labs ordered are listed, but only abnormal results are displayed) Labs Reviewed  CBC  COMPREHENSIVE METABOLIC PANEL WITH GFR  LIPASE, BLOOD     EKG     RADIOLOGY    PROCEDURES:  Critical Care performed: No   Procedures   MEDICATIONS ORDERED IN ED: Medications  pantoprazole  (PROTONIX ) injection 40 mg (has no administration in time range)  sodium chloride  0.9 % bolus 500 mL (500 mLs Intravenous New Bag/Given 04/23/24 0004)     IMPRESSION / MDM / ASSESSMENT AND PLAN / ED COURSE  I reviewed the triage vital signs and the nursing notes.                              Differential diagnosis includes, but is  not limited to, esophageal obstruction, GERD with esophagitis, esophageal stricture, esophageal versus stomach mass  Patient's presentation is most consistent with acute complicated illness / injury requiring diagnostic workup.  Patient is a 53 year old male presenting to the emergency department with hiccuping and foreign body sensation with some difficulty swallowing and emesis x 3 to 4 days.  Patient is hiccuping continuously on exam.  Vital signs with elevated blood pressure and tachycardic at 101.  Will have vital signs repeated by RN.  Labs ordered and pending.  He is not having any chest pain or abdominal pain currently.  He has had some intermittent vomiting after eating, but none currently. No current nausea. Will provide a bolus of fluids and trial protonix  and check labs with close monitoring. +/- barium swallow pending protonix  trial.  Patient will need to follow-up with GI outpatient with some encouragement to complete an  EGD given he refused the last one.  Patient handed off to oncoming provider Dr. Lorelle Cheadle at the end of my shift.  She will take over his care from this point and determine if further workup is needed and patient's disposition.    FINAL CLINICAL IMPRESSION(S) / ED DIAGNOSES   Final diagnoses:  Hiccup  Sensation of foreign body in esophagus      Note:  This document was prepared using Dragon voice recognition software and may include unintentional dictation errors.     Sheron Salm, PA-C 04/23/24 0019    Dicky Anes, MD 04/23/24 [phone]

## 2024-04-22 NOTE — ED Triage Notes (Signed)
 Pt reports hx of esophagitis, symptoms of same x 3-4 days, pt reports feeling as if food does not make it to his stomach

## 2024-04-23 ENCOUNTER — Emergency Department

## 2024-04-23 DIAGNOSIS — R079 Chest pain, unspecified: Secondary | ICD-10-CM | POA: Diagnosis not present

## 2024-04-23 LAB — COMPREHENSIVE METABOLIC PANEL WITH GFR
ALT: 65 U/L — ABNORMAL HIGH (ref 0–44)
AST: 70 U/L — ABNORMAL HIGH (ref 15–41)
Albumin: 3.7 g/dL (ref 3.5–5.0)
Alkaline Phosphatase: 98 U/L (ref 38–126)
Anion gap: 12 (ref 5–15)
BUN: 8 mg/dL (ref 6–20)
CO2: 26 mmol/L (ref 22–32)
Calcium: 8.8 mg/dL — ABNORMAL LOW (ref 8.9–10.3)
Chloride: 104 mmol/L (ref 98–111)
Creatinine, Ser: 0.66 mg/dL (ref 0.61–1.24)
GFR, Estimated: >60 mL/min (ref >60)
Glucose, Bld: 100 mg/dL — ABNORMAL HIGH (ref 70–99)
Potassium: 3.7 mmol/L (ref 3.5–5.1)
Sodium: 142 mmol/L (ref 135–145)
Total Bilirubin: 0.7 mg/dL (ref 0.0–1.2)
Total Protein: 7.6 g/dL (ref 6.5–8.1)

## 2024-04-23 LAB — CBC
HCT: 43.8 % (ref 39.0–52.0)
Hemoglobin: 15 g/dL (ref 13.0–17.0)
MCH: 31.8 pg (ref 26.0–34.0)
MCHC: 34.2 g/dL (ref 30.0–36.0)
MCV: 93 fL (ref 80.0–100.0)
Platelets: 316 K/uL (ref 150–400)
RBC: 4.71 MIL/uL (ref 4.22–5.81)
RDW: 14.2 % (ref 11.5–15.5)
WBC: 7.2 K/uL (ref 4.0–10.5)
nRBC: 0 % (ref 0.0–0.2)

## 2024-04-23 LAB — LIPASE, BLOOD: Lipase: 36 U/L (ref 11–51)

## 2024-04-23 LAB — TROPONIN I (HIGH SENSITIVITY): Troponin I (High Sensitivity): 8 ng/L (ref <18)

## 2024-04-23 MED ORDER — SUCRALFATE 1 G PO TABS: 1.0000 g | ORAL_TABLET | Freq: Two times a day (BID) | ORAL | 0 refills | Status: AC

## 2024-04-23 MED ORDER — ALUM & MAG HYDROXIDE-SIMETH 200-200-20 MG/5ML PO SUSP
30.0000 mL | Freq: Once | ORAL | Status: AC
  Administered 2024-04-23: 30 mL via ORAL
  Filled 2024-04-23: qty 30

## 2024-04-23 MED ORDER — PANTOPRAZOLE SODIUM 40 MG IV SOLR
40.0000 mg | Freq: Once | INTRAVENOUS | Status: AC
  Administered 2024-04-23: 40 mg via INTRAVENOUS
  Filled 2024-04-23: qty 10

## 2024-04-23 MED ORDER — LIDOCAINE VISCOUS HCL 2 % MT SOLN
15.0000 mL | Freq: Once | OROMUCOSAL | Status: AC
  Administered 2024-04-23: 15 mL via ORAL
  Filled 2024-04-23: qty 15

## 2024-04-23 MED ORDER — SODIUM CHLORIDE 0.9 % IV SOLN
25.0000 mg | Freq: Once | INTRAVENOUS | Status: AC
  Administered 2024-04-23: 25 mg via INTRAVENOUS
  Filled 2024-04-23: qty 1

## 2024-04-23 NOTE — ED Provider Notes (Incomplete)
 [facility] Provider Note    Event Date/Time   First MD Initiated Contact with Patient 04/22/24 2314     (approximate)   History   Esophagitis   HPI  Jeffrey House is a 53 y.o. male  with a past medical history of septicemia, hypertension, GERD, esophagitis presents to the emergency department with feeling like he has a foreign body sensation in his esophagus.  Patient reports he has been hiccuping and feels like some food has been getting stuck in his chest, especially after eating.  He has had multiple episodes of vomiting after eating and drinking liquids the past 3 to 4 days.  Patient is currently taking omeprazole  for GERD.  He denies any pain in his chest or abdomen or difficulty breathing or back pain.  Review of chart shows patient was seen for a similar concern on October 01, 2023 in the ER. Followed up with gastroenterology on 01-06-2024 with need for screening colonoscopy and EGD.  Physical Exam   Triage Vital Signs: ED Triage Vitals  Encounter Vitals Group     BP 04/22/24 2236 (!) 154/100     Girls Systolic BP Percentile --      Girls Diastolic BP Percentile --      Boys Systolic BP Percentile --      Boys Diastolic BP Percentile --      Pulse Rate 04/22/24 2236 (!) 101     Resp 04/22/24 2236 17     Temp 04/22/24 2236 97.9 F (36.6 C)     Temp Source 04/22/24 2236 Oral     SpO2 04/22/24 2236 97 %     Weight 04/22/24 2234 155 lb (70.3 kg)     Height 04/22/24 2234 5' 10 (1.778 m)     Head Circumference --      Peak Flow --      Pain Score 04/22/24 2234 0     Pain Loc --      Pain Education --      Exclude from Growth Chart --     Most recent vital signs: Vitals:   04/22/24 2236  BP: (!) 154/100  Pulse: (!) 101  Resp: 17  Temp: 97.9 F (36.6 C)  SpO2: 97%    General: Awake, in no acute distress. Appears stated age. Head: Normocephalic, atraumatic. Eyes: PERRLA. EOMs intact. No scleral icterus or conjunctival  injection. Ears/Nose/Throat: TMs intact b/l. Nares patent, no nasal discharge. Oropharynx moist, no erythema or exudate. Dentition intact. Neck: Supple, no lymphadenopathy, no nuchal rigidity. CV: Good peripheral perfusion. No edema. *** Respiratory:Normal respiratory effort.  No respiratory distress. *** GI: Soft, non-distended, non-tender.  MSK: Normal ROM and  *** strength in *** extremities.  Skin:Warm, dry, intact. No rashes, lesions, or ecchymosis. No cyanosis or pallor. Neurological: A&Ox4 to person, place, time, and situation. Cranial nerves II-XII intact.*** Sensation intact. Strength symmetric. No focal deficits.  Psychiatric: Mood and affect appropriate. Thought processes coherent.   ED Results / Procedures / Treatments   Labs (all labs ordered are listed, but only abnormal results are displayed) Labs Reviewed - No data to display   EKG  ***   RADIOLOGY ***   PROCEDURES:  Critical Care performed: {CriticalCareYesNo:19197::Yes, see critical care procedure note(s),No} Med Data Critical Care [email or web address] ***  Procedures   MEDICATIONS ORDERED IN ED: Medications - No data to display   IMPRESSION / MDM / ASSESSMENT AND PLAN / ED COURSE  I reviewed the triage vital signs and the nursing notes.  Differential diagnosis includes, but is not limited to, ***  Patient's presentation is most consistent with {EM COPA:27473}  *** I independently viewed the x-ray*** and radiologist's report.  I agree with the radiologist's report ***.  {If the patient is on the monitor, remove the brackets and asterisks on the sentence below and remember to document it as a Procedure as well. Otherwise delete the sentence below:1} {**The patient is on the cardiac monitor to evaluate for evidence of arrhythmia and/or significant heart rate changes.**} {Remember to include, when applicable, any/all of the following data: independent review of  imaging independent review of labs (comment specifically on pertinent positives and negatives) review of specific prior hospitalizations, PCP/specialist notes, etc. discuss meds given and prescribed document any discussion with consultants (including hospitalists) any clinical decision tools you used and why (PECARN, NEXUS, etc.) did you consider admitting the patient? document social determinants of health affecting patient's care (homelessness, inability to follow up in a timely fashion, etc) document any pre-existing conditions increasing risk on current visit (e.g. diabetes and HTN increasing danger of high-risk chest pain/ACS) describes what meds you gave (especially parenteral) and why any other interventions?:1}     FINAL CLINICAL IMPRESSION(S) / ED DIAGNOSES   Final diagnoses:  None     Rx / DC Orders   ED Discharge Orders     None        Note:  This document was prepared using Dragon voice recognition software and may include unintentional dictation errors.

## 2024-04-23 NOTE — ED Provider Notes (Addendum)
.-----------------------------------------   12:04 AM on 04/23/2024 -----------------------------------------  Blood pressure (!) 154/100, pulse (!) 101, temperature 97.9 F (36.6 C), temperature source Oral, resp. rate 17, height 5' 10 (1.778 m), weight 70.3 kg, SpO2 97%.  Assuming care from [facility].  In short, Jeffrey House is a 53 y.o. male with a chief complaint of Esophagitis .  Refer to the original H&P for additional details.  The current plan of care is to follow-up on labs, fluids.  On assessment patient states that he has history of esophagitis, when it flares up he tends to get hiccups with it.  No active chest pain or shortness of breath, no abdominal pain, states that he has been able to eat and drink but he feels like it stuck at the level of his stomach.  States that he drinks some water, felt like it was stuck in his stomach so drank a bottle of gin.  He is able to maintain his secretions, says that he was scheduled for an endoscopy and possible dilatation of his esophagus but he declined to proceed at that time.   On independent review, he was seen by gastroenterology in July for GERD dyspepsia, he was started on omeprazole , scheduled for an EGD.  EKG without prolonged QTc, will also give him a dose of Thorazine here given the hiccups.  On exam he is nontoxic appearing, moist mucous membranes, he is maintaining his secretions, abdomen soft nontender.  EKG shows sinus rhythm, rate 81, normal QRS, normal QTc, no obvious ischemic ST elevation, baseline is wandering, flattening to aVL, not significantly changed compared to prior  On reassessment patient still having hiccups, states that he is not bothered by the hiccups.  Was asking to leave.  Was able to tolerate p.o.  Still maintaining his secretions.  Will prescribe him some sucralfate .  Did discuss with him that he needs to follow-up with GI for further management of his symptoms.  Otherwise considered but no indication for  inpatient admission at that time, safe for outpatient management.  Will discharge with strict return precautions.  Clinical Course as of 04/23/24 9271  [place] Apr 23, 2024  0058 DG Chest 1 View 1. No acute cardiopulmonary process.  [TT]  9941 Independent review of labs, no leukocytosis, electrolytes not severely deranged, his AST and ALT are mildly elevated, lipase is normal.  He has no jaundice or right upper quadrant tenderness at this time, suspect this could be hepatic steatosis versus secondary to alcohol use. [TT]    Clinical Course User Index [TT] Waymond Lorelle Cummins, MD      Waymond Lorelle Cummins, MD 04/23/24 9786    Waymond Lorelle Cummins, MD 04/23/24 [phone]

## 2024-04-23 NOTE — Discharge Instructions (Addendum)
 Please take your Protonix , sucralfate  as prescribed for your symptoms.  Please ensure to follow-up with the gastroenterologist to get an endoscopy for your symptoms.

## 2024-05-04 ENCOUNTER — Encounter: Payer: Self-pay | Admitting: Oncology

## 2024-05-04 ENCOUNTER — Ambulatory Visit: Payer: Self-pay

## 2024-05-04 DIAGNOSIS — R1310 Dysphagia, unspecified: Secondary | ICD-10-CM | POA: Diagnosis not present

## 2024-05-04 DIAGNOSIS — R1314 Dysphagia, pharyngoesophageal phase: Secondary | ICD-10-CM | POA: Diagnosis not present

## 2024-05-07 ENCOUNTER — Emergency Department

## 2024-05-07 ENCOUNTER — Other Ambulatory Visit: Payer: Self-pay

## 2024-05-07 ENCOUNTER — Emergency Department
Admission: EM | Admit: 2024-05-07 | Discharge: 2024-05-07 | Disposition: A | Discharge status: Home / Self Care | Attending: Emergency Medicine | Admitting: Emergency Medicine

## 2024-05-07 DIAGNOSIS — K224 Dyskinesia of esophagus: Secondary | ICD-10-CM | POA: Diagnosis not present

## 2024-05-07 DIAGNOSIS — R066 Hiccough: Secondary | ICD-10-CM | POA: Diagnosis not present

## 2024-05-07 DIAGNOSIS — R918 Other nonspecific abnormal finding of lung field: Secondary | ICD-10-CM | POA: Diagnosis not present

## 2024-05-07 DIAGNOSIS — I700 Atherosclerosis of aorta: Secondary | ICD-10-CM | POA: Diagnosis not present

## 2024-05-07 LAB — COMPREHENSIVE METABOLIC PANEL WITH GFR
ALT: 22 U/L (ref 0–44)
AST: 25 U/L (ref 15–41)
Albumin: 4.2 g/dL (ref 3.5–5.0)
Alkaline Phosphatase: 131 U/L — ABNORMAL HIGH (ref 38–126)
Anion gap: 13 (ref 5–15)
BUN: 6 mg/dL (ref 6–20)
CO2: 25 mmol/L (ref 22–32)
Calcium: 9 mg/dL (ref 8.9–10.3)
Chloride: 102 mmol/L (ref 98–111)
Creatinine, Ser: 0.7 mg/dL (ref 0.61–1.24)
GFR, Estimated: >60 mL/min (ref >60)
Glucose, Bld: 104 mg/dL — ABNORMAL HIGH (ref 70–99)
Potassium: 3.9 mmol/L (ref 3.5–5.1)
Sodium: 140 mmol/L (ref 135–145)
Total Bilirubin: 0.3 mg/dL (ref 0.0–1.2)
Total Protein: 7.5 g/dL (ref 6.5–8.1)

## 2024-05-07 LAB — CBC WITH DIFFERENTIAL/PLATELET
Abs Immature Granulocytes: 0.01 K/uL (ref 0.00–0.07)
Basophils Absolute: 0.1 K/uL (ref 0.0–0.1)
Basophils Relative: 1 %
Eosinophils Absolute: 0.1 K/uL (ref 0.0–0.5)
Eosinophils Relative: 2 %
HCT: 43.6 % (ref 39.0–52.0)
Hemoglobin: 14.7 g/dL (ref 13.0–17.0)
Immature Granulocytes: 0 %
Lymphocytes Relative: 20 %
Lymphs Abs: 1.2 K/uL (ref 0.7–4.0)
MCH: 32.1 pg (ref 26.0–34.0)
MCHC: 33.7 g/dL (ref 30.0–36.0)
MCV: 95.2 fL (ref 80.0–100.0)
Monocytes Absolute: 0.5 K/uL (ref 0.1–1.0)
Monocytes Relative: 8 %
Neutro Abs: 4.1 K/uL (ref 1.7–7.7)
Neutrophils Relative %: 69 %
Platelets: 338 K/uL (ref 150–400)
RBC: 4.58 MIL/uL (ref 4.22–5.81)
RDW: 14.3 % (ref 11.5–15.5)
WBC: 6 K/uL (ref 4.0–10.5)
nRBC: 0 % (ref 0.0–0.2)

## 2024-05-07 MED ORDER — IOHEXOL 300 MG/ML  SOLN
80.0000 mL | Freq: Once | INTRAMUSCULAR | Status: AC | PRN
  Administered 2024-05-07: 80 mL via INTRAVENOUS

## 2024-05-07 MED ORDER — GLUCAGON HCL RDNA (DIAGNOSTIC) 1 MG IJ SOLR
1.0000 mg | Freq: Once | INTRAMUSCULAR | Status: AC | PRN
  Administered 2024-05-07: 1 mg via INTRAVENOUS
  Filled 2024-05-07: qty 1

## 2024-05-07 MED ORDER — ONDANSETRON HCL 4 MG/2ML IJ SOLN
4.0000 mg | Freq: Once | INTRAMUSCULAR | Status: AC
  Administered 2024-05-07: 4 mg via INTRAVENOUS
  Filled 2024-05-07: qty 2

## 2024-05-07 NOTE — ED Notes (Signed)
 Patient transported to CT

## 2024-05-07 NOTE — ED Provider Notes (Signed)
 [facility] Provider Note   Event Date/Time   First MD Initiated Contact with Patient 05/07/24 0820     (approximate) History  Food bolus  HPI Jeffrey House is a 53 y.o. male with a past medical history of recurrent hiccups and esophagitis who presents complaining of hiccups and foreign body sensation in the lower esophagus after eating.  Patient states that he is had 1 meal since he previous endoscopy done 4 days prior to arrival and has vomited this meal soon after eating ROS: Patient currently denies any vision changes, tinnitus, difficulty speaking, facial droop, sore throat, chest pain, shortness of breath, abdominal pain, nausea/vomiting/diarrhea, dysuria, or weakness/numbness/paresthesias in any extremity   Physical Exam  Triage Vital Signs: ED Triage Vitals  Encounter Vitals Group     BP 05/07/24 0810 (!) 136/97     Girls Systolic BP Percentile --      Girls Diastolic BP Percentile --      Boys Systolic BP Percentile --      Boys Diastolic BP Percentile --      Pulse Rate 05/07/24 0810 (!) 101     Resp 05/07/24 0810 18     Temp 05/07/24 0810 98 F (36.7 C)     Temp src --      SpO2 05/07/24 0810 100 %     Weight 05/07/24 0809 155 lb (70.3 kg)     Height 05/07/24 0809 5' 10 (1.778 m)     Head Circumference --      Peak Flow --      Pain Score 05/07/24 0809 0     Pain Loc --      Pain Education --      Exclude from Growth Chart --    Most recent vital signs: Vitals:   05/07/24 0810 05/07/24 0936  BP: (!) 136/97 (!) 130/106  Pulse: (!) 101 94  Resp: 18 18  Temp: 98 F (36.7 C)   SpO2: 100% 92%   General: Awake, oriented x4. CV:  Good peripheral perfusion. Resp:  Normal effort. Abd:  No distention. Other:  Middle-aged well-developed, well-nourished African-American male resting comfortably in no acute distress.  Belching ED Results / Procedures / Treatments  Labs (all labs ordered are listed, but only abnormal results are  displayed) Labs Reviewed  COMPREHENSIVE METABOLIC PANEL WITH GFR - Abnormal; Notable for the following components:      Result Value   Glucose, Bld 104 (*)    Alkaline Phosphatase 131 (*)    All other components within normal limits  CBC WITH DIFFERENTIAL/PLATELET   RADIOLOGY ED MD interpretation: CT of the chest with IV contrast shows patulous and mildly dilated esophagus with retained debris layering in the dependent portion.  There is small bilateral lung nodules with the largest measuring 6 mm - All radiology independently interpreted and agree with radiology assessment Official radiology report(s): CT Chest W Contrast Result Date: 05/07/2024 EXAM: CT CHEST WITH CONTRAST 05/07/2024 09:31:03 AM TECHNIQUE: CT of the chest was performed with the administration of 80 mL of iohexol  (OMNIPAQUE ) 300 MG/ML solution. Multiplanar reformatted images are provided for review. Automated exposure control, iterative reconstruction, and/or weight based adjustment of the mA/kV was utilized to reduce the radiation dose to as low as reasonably achievable. COMPARISON: Esophagram 10/01/2023 CLINICAL HISTORY: Foreign body sensation after eating. Possible esophageal impaction. FINDINGS: MEDIASTINUM: Heart and pericardium are unremarkable. Aortic atherosclerotic calcifications. Esophagus appears patulous, mildly dilated with retained debris identified layering within the dependent portion of  the esophagus. The central airways are clear. LYMPH NODES: No mediastinal, hilar or axillary lymphadenopathy. LUNGS AND PLEURA: Mild ground glass attenuation with scattered areas of air trapping identified bilaterally, suggestive of small airways disease. Bilateral lung nodules identified which are nonspecific. The index nodule in the left upper lobe measures 4 mm, image 80/3. Subpleural nodule in the posterior aspect of the right lower lobe measures 0.6 x 0.4 cm (mean diameter 5 mm). 6 mm nodule identified within the right base,  image 105/3. No focal consolidation or pulmonary edema. No pleural effusion or pneumothorax. SOFT TISSUES/BONES: No acute abnormality of the bones or soft tissues. UPPER ABDOMEN: Limited images of the upper abdomen demonstrates no acute abnormality. IMPRESSION: 1. Patulous, mildly dilated esophagus with retained debris layering within the dependent portion, which may account for the reported sensation. 2. Small bilateral lung nodules, largest 6 mm. According to consensus criteria, follow-up imaging with CT of the chest at 3 to 6 months is advised. Electronically signed by: Waddell Calk MD 05/07/2024 09:42 AM EST RP Workstation: [id number]   PROCEDURES: Critical Care performed: No Procedures MEDICATIONS ORDERED IN ED: Medications  glucagon  (human recombinant) (GLUCAGEN ) injection 1 mg (1 mg Intravenous Given 05/07/24 0850)  ondansetron  (ZOFRAN ) injection 4 mg (4 mg Intravenous Given 05/07/24 0835)  iohexol  (OMNIPAQUE ) 300 MG/ML solution 80 mL (80 mLs Intravenous Contrast Given 05/07/24 0924)   IMPRESSION / MDM / ASSESSMENT AND PLAN / ED COURSE  I reviewed the triage vital signs and the nursing notes.                             The patient is on the cardiac monitor to evaluate for evidence of arrhythmia and/or significant heart rate changes. Patient's presentation is most consistent with acute presentation with potential threat to life or bodily function. Patient's 53 year old male with the above-stated past medical history and recently diagnosed esophageal dysmotility who presents complaining of postprandial vomiting with solid foods, persistent belching, and lower anterior chest/epigastric pain after eating DDx: Esophageal dysmotility, esophageal impaction, reflux, esophageal stricture Plan: CBC, CMP, CT of the chest with IV contrast  Laboratory radiologic evaluation has shown significant evidence of likely esophageal dysmotility with a patulous distal esophagus as well as layering of debris  in the distal esophagus.  I spoke to on-call gastroenterology who recommends staying on patient's current medication regimen including Pepcid , omeprazole , and Carafate .  Also recommends moving towards more liquid diet and following up with GI in this next week.  Patient was given these recommendations and agrees with plan for discharge at this time with GI follow-up in the outpatient setting.  Patient given strict return precautions and all questions answered prior to discharge  Dispo: Discharge home with GI follow-up   FINAL CLINICAL IMPRESSION(S) / ED DIAGNOSES   Final diagnoses:  Esophageal dysmotility   Rx / DC Orders   ED Discharge Orders          Ordered    Ambulatory referral to Gastroenterology        05/07/24 1030           Note:  This document was prepared using Dragon voice recognition software and may include unintentional dictation errors.   Evangela Heffler K, MD 05/07/24 [phone]

## 2024-05-07 NOTE — ED Triage Notes (Addendum)
 Pt comes with c/o food stuck in his throat. Pt states hx of this. Pt has hx of esophagitis. Pt states he can swallow but painful. Pt states hiccups and feels it coming out through his nose and eyes like burning.

## 2024-06-06 ENCOUNTER — Other Ambulatory Visit: Payer: Self-pay | Admitting: Physician Assistant

## 2024-06-06 DIAGNOSIS — I10 Essential (primary) hypertension: Secondary | ICD-10-CM
